# Patient Record
Sex: Female | Born: 1935 | Race: White | Hispanic: No | Smoking: Never smoker
Health system: Southern US, Community
[De-identification: ages and names within clinical notes are randomized; demographics above are authoritative.]

## PROBLEM LIST (undated history)

## (undated) DIAGNOSIS — I639 Cerebral infarction, unspecified: Secondary | ICD-10-CM

## (undated) DIAGNOSIS — I10 Essential (primary) hypertension: Secondary | ICD-10-CM

## (undated) DIAGNOSIS — F0390 Unspecified dementia without behavioral disturbance: Secondary | ICD-10-CM

## (undated) DIAGNOSIS — F329 Major depressive disorder, single episode, unspecified: Secondary | ICD-10-CM

## (undated) DIAGNOSIS — F32A Depression, unspecified: Secondary | ICD-10-CM

## (undated) DIAGNOSIS — M1990 Unspecified osteoarthritis, unspecified site: Secondary | ICD-10-CM

## (undated) DIAGNOSIS — Z86718 Personal history of other venous thrombosis and embolism: Secondary | ICD-10-CM

## (undated) DIAGNOSIS — E079 Disorder of thyroid, unspecified: Secondary | ICD-10-CM

## (undated) DIAGNOSIS — N390 Urinary tract infection, site not specified: Secondary | ICD-10-CM

## (undated) HISTORY — DX: Unspecified osteoarthritis, unspecified site: M19.90

## (undated) HISTORY — DX: Depression, unspecified: F32.A

## (undated) HISTORY — DX: Urinary tract infection, site not specified: N39.0

## (undated) HISTORY — DX: Unspecified dementia, unspecified severity, without behavioral disturbance, psychotic disturbance, mood disturbance, and anxiety: F03.90

## (undated) HISTORY — DX: Cerebral infarction, unspecified: I63.9

## (undated) HISTORY — DX: Major depressive disorder, single episode, unspecified: F32.9

## (undated) HISTORY — DX: Disorder of thyroid, unspecified: E07.9

## (undated) HISTORY — DX: Personal history of other venous thrombosis and embolism: Z86.718

## (undated) HISTORY — DX: Essential (primary) hypertension: I10

---

## 2017-09-03 ENCOUNTER — Ambulatory Visit (INDEPENDENT_AMBULATORY_CARE_PROVIDER_SITE_OTHER): Payer: Medicare Other | Admitting: Family Medicine

## 2017-09-03 ENCOUNTER — Encounter: Payer: Self-pay | Admitting: Family Medicine

## 2017-09-03 VITALS — BP 126/64 | HR 78 | Temp 98.7°F | Ht 65.0 in | Wt 171.0 lb

## 2017-09-03 DIAGNOSIS — R600 Localized edema: Secondary | ICD-10-CM | POA: Diagnosis not present

## 2017-09-03 DIAGNOSIS — Z7689 Persons encountering health services in other specified circumstances: Secondary | ICD-10-CM

## 2017-09-03 DIAGNOSIS — I10 Essential (primary) hypertension: Secondary | ICD-10-CM

## 2017-09-03 DIAGNOSIS — R350 Frequency of micturition: Secondary | ICD-10-CM | POA: Diagnosis not present

## 2017-09-03 DIAGNOSIS — E079 Disorder of thyroid, unspecified: Secondary | ICD-10-CM

## 2017-09-03 LAB — BASIC METABOLIC PANEL
BUN: 23 mg/dL (ref 6–23)
CALCIUM: 9.4 mg/dL (ref 8.4–10.5)
CHLORIDE: 102 meq/L (ref 96–112)
CO2: 28 meq/L (ref 19–32)
CREATININE: 0.98 mg/dL (ref 0.40–1.20)
GFR: 56.68 mL/min — ABNORMAL LOW (ref >60.00)
Glucose, Bld: 95 mg/dL (ref 70–99)
Potassium: 4.1 mEq/L (ref 3.5–5.1)
Sodium: 142 mEq/L (ref 135–145)

## 2017-09-03 LAB — CBC
HEMATOCRIT: 38.3 % (ref 36.0–46.0)
Hemoglobin: 12.8 g/dL (ref 12.0–15.0)
MCHC: 33.4 g/dL (ref 30.0–36.0)
MCV: 96.1 fl (ref 78.0–100.0)
Platelets: 244 10*3/uL (ref 150.0–400.0)
RBC: 3.99 Mil/uL (ref 3.87–5.11)
RDW: 13.4 % (ref 11.5–15.5)
WBC: 9.9 10*3/uL (ref 4.0–10.5)

## 2017-09-03 LAB — POC URINALSYSI DIPSTICK (AUTOMATED)
Bilirubin, UA: NEGATIVE
GLUCOSE UA: NEGATIVE
KETONES UA: NEGATIVE
Nitrite, UA: NEGATIVE
RBC UA: NEGATIVE
SPEC GRAV UA: 1.02 (ref 1.010–1.025)
Urobilinogen, UA: 1 E.U./dL
pH, UA: 6 (ref 5.0–8.0)

## 2017-09-03 LAB — TSH: TSH: 3.28 u[IU]/mL (ref 0.35–4.50)

## 2017-09-03 LAB — BRAIN NATRIURETIC PEPTIDE: PRO B NATRI PEPTIDE: 112 pg/mL — AB (ref 0.0–100.0)

## 2017-09-03 MED ORDER — FUROSEMIDE 20 MG PO TABS: 20.0000 mg | ORAL_TABLET | Freq: Every day | ORAL | 0 refills | Status: DC

## 2017-09-03 NOTE — Patient Instructions (Signed)

## 2017-09-03 NOTE — Progress Notes (Signed)
Patient presents to clinic today to establish care.  Patient is accompanied by her niece and nephew.  SUBJECTIVE: PMH:Pt is an 84 yo female with pmh sig for HTN, dementia, thyroid disorder, CVA, arthritis, depression, h/o blood clots.  Pt was previously seen in [place].  She was recently hospitalized there for depression and UTI.   Pt has been relocated to the area.  She will be living at [facility].  LE edema: -chronic issues -pt does not like wearing TED hose -pt elevates her feet when resting and does not use much salt. -no h/o CHF  HTN: -taking norvasc 10 mg, lopressor 25 mg BID  Depression: -currenlty taking Prozac 20 mg daily -also has ativan 0.5 mg q 8hrs. -pt does not have a psychiatrist in the area  Dementia: -family does not want pt to know this.  Thyroid d/o: -pt does not recall details. -taking levothyroxine 50 mcg daily  Past Medical History:  Diagnosis Date  . Arthritis   . Dementia   . Depression   . H/O blood clots   . Hypertension   . Stroke (HCC)   . Thyroid disease   . Urinary tract infection      Current Outpatient Medications on File Prior to Visit  Medication Sig Dispense Refill  . amLODipine (NORVASC) 10 MG tablet Take 10 mg by mouth daily.    Marland Kitchen FLUoxetine (PROZAC) 20 MG tablet Take 20 mg by mouth daily.    Marland Kitchen levothyroxine (SYNTHROID, LEVOTHROID) 50 MCG tablet Take 50 mcg by mouth daily before breakfast.    . LORazepam (ATIVAN) 0.5 MG tablet Take 0.5 mg by mouth every 8 (eight) hours.    . Melatonin 10 MG TABS Take 10 mg by mouth at bedtime.    . metoprolol tartrate (LOPRESSOR) 25 MG tablet Take 25 mg by mouth 2 (two) times daily.    . Multiple Vitamins-Minerals (EYE VITAMINS PO) Take by mouth.     No current facility-administered medications on file prior to visit.     Allergies  Allergen Reactions  . Iron   . Plaquenil [Hydroxychloroquine Sulfate] Other (See Comments)    Bleached skin    History reviewed. No pertinent family  history.  Social History   Socioeconomic History  . Marital status: Widowed    Spouse name: Not on file  . Number of children: Not on file  . Years of education: Not on file  . Highest education level: Not on file  Occupational History  . Not on file  Social Needs  . Financial resource strain: Not on file  . Food insecurity:    Worry: Not on file    Inability: Not on file  . Transportation needs:    Medical: Not on file    Non-medical: Not on file  Tobacco Use  . Smoking status: Never Smoker  . Smokeless tobacco: Never Used  Substance and Sexual Activity  . Alcohol use: Never    Frequency: Never  . Drug use: Never  . Sexual activity: Not on file  Lifestyle  . Physical activity:    Days per week: Not on file    Minutes per session: Not on file  . Stress: Not on file  Relationships  . Social connections:    Talks on phone: Not on file    Gets together: Not on file    Attends religious service: Not on file    Active member of club or organization: Not on file    Attends meetings of  clubs or organizations: Not on file    Relationship status: Not on file  . Intimate partner violence:    Fear of current or ex partner: Not on file    Emotionally abused: Not on file    Physically abused: Not on file    Forced sexual activity: Not on file  Other Topics Concern  . Not on file  Social History Narrative  . Not on file    ROS General: Denies fever, chills, night sweats, changes in weight, changes in appetite HEENT: Denies headaches, ear pain, changes in vision, rhinorrhea, sore throat CV: Denies CP, palpitations, SOB, orthopnea Pulm: Denies SOB, cough, wheezing GI: Denies abdominal pain, nausea, vomiting, diarrhea, constipation GU: Denies hematuria, vaginal discharge  +dysuria, frequency Msk: Denies muscle cramps, joint pains   +b/l LE edema Neuro: Denies weakness, numbness, tingling Skin: Denies rashes, bruising Psych: Denies depression, anxiety, hallucinations  BP  126/64 (BP Location: Left Arm, Patient Position: Sitting, Cuff Size: Normal)   Pulse 78   Temp 98.7 F (37.1 C) (Oral)   Ht  (1.651 m)   Wt 171 lb (77.6 kg)   SpO2 96%   BMI 28.46 kg/m   Physical Exam Gen. Pleasant, well developed, well-nourished, in NAD HEENT - [place]/AT, PERRL, no scleral icterus, no nasal drainage, pharynx without erythema or exudate. Lungs: no use of accessory muscles, no dullness to percussion, CTAB, no wheezes, rales or rhonchi Cardiovascular: RRR, No r/g/m, 1+ pitting edema in b/l ankles Abdomen: BS present, soft, nontender, nondistended Musculoskeletal: No deformities, moves all four extremities, no cyanosis or clubbing, normal tone Neuro:  A&Ox3, CN II-XII intact, gait not assessed. Sitting in wheelchair. Skin:  Warm, dry, intact, no lesions   Recent Results (from the past 2160 hour(s))  Basic metabolic panel     Status: Abnormal   Collection Time: 09/03/17 12:25 PM  Result Value Ref Range   Sodium 142 135 - 145 mEq/L   Potassium 4.1 3.5 - 5.1 mEq/L   Chloride 102 96 - 112 mEq/L   CO2 28 19 - 32 mEq/L   Glucose, Bld 95 70 - 99 mg/dL   BUN 23 6 - 23 mg/dL   Creatinine, Ser 0.98 0.40 - 1.20 mg/dL   Calcium 9.4 8.4 - 11.9 mg/dL   GFR 14.78 (L) >29.56 mL/min  CBC (no diff)     Status: None   Collection Time: 09/03/17 12:25 PM  Result Value Ref Range   WBC 9.9 4.0 - 10.5 K/uL   RBC 3.99 3.87 - 5.11 Mil/uL   Platelets 244.0 150.0 - 400.0 K/uL   Hemoglobin 12.8 12.0 - 15.0 g/dL   HCT 21.3 08.6 - 57.8 %   MCV 96.1 78.0 - 100.0 fl   MCHC 33.4 30.0 - 36.0 g/dL   RDW 46.9 62.9 - 52.8 %  Brain Natriuretic Peptide     Status: Abnormal   Collection Time: 09/03/17 12:25 PM  Result Value Ref Range   Pro B Natriuretic peptide (BNP) 112.0 (H) 0.0 - 100.0 pg/mL  TSH     Status: None   Collection Time: 09/03/17 12:25 PM  Result Value Ref Range   TSH 3.28 0.35 - 4.50 uIU/mL  POCT Urinalysis Dipstick (Automated)     Status: Abnormal   Collection Time:  09/03/17  1:37 PM  Result Value Ref Range   Color, UA yellow    Clarity, UA clear    Glucose, UA n    Bilirubin, UA n    Ketones, UA n  Spec Grav, UA 1.020 1.010 - 1.025   Blood, UA n    pH, UA 6.0 5.0 - 8.0   Protein, UA 1+    Urobilinogen, UA 1.0 0.2 or 1.0 E.U./dL   Nitrite, UA n    Leukocytes, UA Small (1+) (A) Negative    Assessment/Plan: Urinary frequency  - Plan: POCT Urinalysis Dipstick (Automated)   Lower extremity edema  -Patient taking Norvasc 10 mg daily which may be contributing to lower extremity edema -We will obtain labs -Given handout -Encouraged to continue elevating her extremities when resting and limit sodium intake. - Plan: Basic metabolic panel, CBC (no diff), Brain Natriuretic Peptide ---Of note BMP at 112.  Will send in rx for lasix 20 mg daily x 3 days.  Encounter to establish care --We reviewed the PMH, PSH, FH, SH, Meds and Allergies. -We provided refills for any medications we will prescribe as needed. -We addressed current concerns per orders and patient instructions. -We have asked for records for pertinent exams, studies, vaccines and notes from previous providers. -We have advised patient to follow up per instructions below.  Thyroid disorder  -Continue levothyroxine 50 mcg daily - Plan: TSH  Essential hypertension -controlled -continue Lopressor 25 mg twice daily and Norvasc 10 mg daily   F/u in the next month, sooner if needed.  Unable to address all of family's concerns as they arrived at 11:09 am for the 11 am new pt appt, and were not ready for the provider until 11:30 am.  Abbe Amsterdam, MD

## 2017-09-06 ENCOUNTER — Telehealth: Payer: Self-pay | Admitting: Family Medicine

## 2017-09-06 ENCOUNTER — Other Ambulatory Visit: Payer: Self-pay | Admitting: Family Medicine

## 2017-09-06 DIAGNOSIS — N3000 Acute cystitis without hematuria: Secondary | ICD-10-CM

## 2017-09-06 MED ORDER — NITROFURANTOIN MONOHYD MACRO 100 MG PO CAPS: 100.0000 mg | ORAL_CAPSULE | Freq: Two times a day (BID) | ORAL | 0 refills | Status: AC

## 2017-09-06 NOTE — Telephone Encounter (Signed)
Patient's legal guardian Cassandra Patton called and says she spoke to a nurse on call this weekend about her cough and pink eye. She says an eye drop was prescribed and wanted to make sure there was notation about it in the chart. I advised if the symptoms did not improve, call the office back for an appointment.

## 2017-09-07 NOTE — Telephone Encounter (Signed)
Polytrim eye drops called in by [facility]. Medication added to patient's list.

## 2017-09-10 ENCOUNTER — Other Ambulatory Visit: Payer: Self-pay | Admitting: Family Medicine

## 2017-09-10 ENCOUNTER — Encounter: Payer: Self-pay | Admitting: Adult Health

## 2017-09-10 ENCOUNTER — Ambulatory Visit (INDEPENDENT_AMBULATORY_CARE_PROVIDER_SITE_OTHER): Payer: Medicare Other | Admitting: Adult Health

## 2017-09-10 ENCOUNTER — Ambulatory Visit: Payer: Self-pay

## 2017-09-10 VITALS — BP 130/64 | Temp 98.3°F | Wt 172.0 lb

## 2017-09-10 DIAGNOSIS — R600 Localized edema: Secondary | ICD-10-CM

## 2017-09-10 MED ORDER — FUROSEMIDE 20 MG PO TABS: 20.0000 mg | ORAL_TABLET | Freq: Every day | ORAL | 0 refills | Status: DC

## 2017-09-10 NOTE — Progress Notes (Signed)
Subjective:    Patient ID: Cassandra Patton, female    DOB: 1926-04-15, 84 y.o.   MRN: [id number]  HPI  84 year old female who  has a past medical history of Arthritis, Dementia, Depression, H/O blood clots, Hypertension, Stroke (HCC), Thyroid disease, and Urinary tract infection. She is a patient of Dr. Salomon Fick who I am seeing today for the complaint of bilateral lower extremity swelling. She was recently prescribed a 3 day of Lasix 20 mg. Family reports no change in the amount of edema. She denies any CP or SOB  Wt Readings from Last 3 Encounters:  09/10/17 172 lb (78 kg)  09/03/17 171 lb (77.6 kg)    Review of Systems See HPI   Past Medical History:  Diagnosis Date  . Arthritis   . Dementia   . Depression   . H/O blood clots   . Hypertension   . Stroke (HCC)   . Thyroid disease   . Urinary tract infection     Social History   Socioeconomic History  . Marital status: Widowed    Spouse name: Not on file  . Number of children: Not on file  . Years of education: Not on file  . Highest education level: Not on file  Occupational History  . Not on file  Social Needs  . Financial resource strain: Not on file  . Food insecurity:    Worry: Not on file    Inability: Not on file  . Transportation needs:    Medical: Not on file    Non-medical: Not on file  Tobacco Use  . Smoking status: Never Smoker  . Smokeless tobacco: Never Used  Substance and Sexual Activity  . Alcohol use: Never    Frequency: Never  . Drug use: Never  . Sexual activity: Not on file  Lifestyle  . Physical activity:    Days per week: Not on file    Minutes per session: Not on file  . Stress: Not on file  Relationships  . Social connections:    Talks on phone: Not on file    Gets together: Not on file    Attends religious service: Not on file    Active member of club or organization: Not on file    Attends meetings of clubs or organizations: Not on file    Relationship status: Not on file  .  Intimate partner violence:    Fear of current or ex partner: Not on file    Emotionally abused: Not on file    Physically abused: Not on file    Forced sexual activity: Not on file  Other Topics Concern  . Not on file  Social History Narrative  . Not on file    History reviewed. No pertinent surgical history.  History reviewed. No pertinent family history.  Allergies  Allergen Reactions  . Iron   . Plaquenil [Hydroxychloroquine Sulfate] Other (See Comments)    Bleached skin    Current Outpatient Medications on File Prior to Visit  Medication Sig Dispense Refill  . amLODipine (NORVASC) 10 MG tablet Take 10 mg by mouth daily.    Marland Kitchen FLUoxetine (PROZAC) 20 MG tablet Take 20 mg by mouth daily.    Marland Kitchen levothyroxine (SYNTHROID, LEVOTHROID) 50 MCG tablet Take 50 mcg by mouth daily before breakfast.    . LORazepam (ATIVAN) 0.5 MG tablet Take 0.5 mg by mouth every 8 (eight) hours.    . Melatonin 10 MG TABS Take 10 mg by mouth  at bedtime.    . metoprolol tartrate (LOPRESSOR) 25 MG tablet Take 25 mg by mouth 2 (two) times daily.    . Multiple Vitamins-Minerals (EYE VITAMINS PO) Take by mouth.    . nitrofurantoin, macrocrystal-monohydrate, (MACROBID) 100 MG capsule Take 1 capsule (100 mg total) by mouth 2 (two) times daily for 5 days. 10 capsule 0   No current facility-administered medications on file prior to visit.     BP 130/64   Temp 98.3 F (36.8 C) (Oral)   Wt 172 lb (78 kg)   BMI 28.62 kg/m       Objective:   Physical Exam  Constitutional: She is oriented to person, place, and time. She appears well-developed and well-nourished. No distress.  Cardiovascular: Normal rate, regular rhythm, normal heart sounds and intact distal pulses. Exam reveals no gallop and no friction rub.  No murmur heard. Pulmonary/Chest: Effort normal and breath sounds normal. No stridor. No respiratory distress. She has no wheezes. She has no rales.  Musculoskeletal: She exhibits edema (+2 bilateral  pitting edema ).  Neurological: She is alert and oriented to person, place, and time.  Skin: Skin is warm and dry. Capillary refill takes less than 2 seconds. She is not diaphoretic.  Psychiatric: She has a normal mood and affect. Her behavior is normal. Judgment and thought content normal.  Nursing note and vitals reviewed.     Assessment & Plan:  1. Lower extremity edema - Will trial her on a longer course of Lasix. Advised follow up with PCP next week  - furosemide (LASIX) 20 MG tablet; Take 1 tablet (20 mg total) by mouth daily.  Dispense: 10 tablet; Refill: 0 - Consider changing Norvasc or Metoprolol due to potential for lower extremity swelling   Shirline Frees, NP

## 2017-09-10 NOTE — Telephone Encounter (Signed)
    Incoming call from nephew who is on DPI, states that he is not with the pt. But  Wanted to discuss concerns about lower bilateral extremity swelling,   From feet to hips. Pt. Did take the 3 day dose of Lasix. Nephew states no improvement.  Denies SOB nor chest pain. Nephew does state she has "cold symptoms".  Nephew states when pressing on area of swelling "it bounces back".   Due to significant LE swelling, scheduled appointment in PCP office at 2:45pm.  Nephew voiced understanding.   Reason for Disposition . SEVERE leg swelling (e.g., swelling extends above knee, entire leg is swollen, weeping fluid)  Answer Assessment - Initial Assessment Questions 1. ONSET: "When did the swelling start?" (e.g., minutes, hours, days)    5/10  2. LOCATION: "What part of the leg is swollen?"  "Are both legs swollen or just one leg?"      Both legs from hip down , ankles and feet 3. SEVERITY: "How bad is the swelling?" (e.g., localized; mild, moderate, severe)  - Localized - small area of swelling localized to one leg  - MILD pedal edema - swelling limited to foot and ankle, pitting edema < 1/4 inch (6 mm) deep, rest and elevation eliminate most or all swelling  - MODERATE edema - swelling of lower leg to knee, pitting edema > 1/4 inch (6 mm) deep, rest and elevation only partially reduce swelling  - SEVERE edema - swelling extends above knee, facial or hand swelling present      severe 4. REDNESS: "Does the swelling look red or infected?"    Bruises," scaring" 5. PAIN: "Is the swelling painful to touch?" If so, ask: "How painful is it?"   (Scale 1-10; mild, moderate or severe)      Pt. Not avilable 6. FEVER: "Do you have a fever?" If so, ask: "What is it, how was it measured, and when did it start?" no     Nephew  Doesn't know 7. CAUSE: "What do you think is causing the leg swelling?"     ongoing 8. MEDICAL HISTORY: "Do you have a history of heart failure, kidney disease, liver failure, or cancer?" hx  of HTN     9. RECURRENT SYMPTOM: "Have you had leg swelling before?" If so, ask: "When was the last time?" "What happened that time?"      10. OTHER SYMPTOMS: "Do you have any other symptoms?" (e.g., chest pain, difficulty breathing) no    11. PREGNANCY: "Is there any chance you are pregnant?" "When was your last menstrual period?"  Protocols used: LEG SWELLING AND EDEMA-A-AH  Summary: swelling follow up     Pt is in independent living - pt was given fluid pills for lowe leg swelling on 09/03/17. Family is calling to let us know she is still having lower leg swelling and wants to know what to do next about it. He is not with her at the moment but will be at the independent living later this am

## 2017-09-10 NOTE — Telephone Encounter (Signed)
Please advise. You have never filled these meds for patient before.

## 2017-09-13 ENCOUNTER — Other Ambulatory Visit: Payer: Self-pay | Admitting: Family Medicine

## 2017-09-13 DIAGNOSIS — I10 Essential (primary) hypertension: Secondary | ICD-10-CM

## 2017-09-13 MED ORDER — METOPROLOL TARTRATE 25 MG PO TABS: 25.0000 mg | ORAL_TABLET | Freq: Two times a day (BID) | ORAL | 0 refills | Status: AC

## 2017-09-13 MED ORDER — LEVOTHYROXINE SODIUM 50 MCG PO TABS: 50.0000 ug | ORAL_TABLET | Freq: Every day | ORAL | 0 refills | Status: DC

## 2017-09-13 MED ORDER — AMLODIPINE BESYLATE 5 MG PO TABS: 5.0000 mg | ORAL_TABLET | Freq: Every day | ORAL | 3 refills | Status: DC

## 2017-09-13 MED ORDER — FLUOXETINE HCL 20 MG PO TABS: 20.0000 mg | ORAL_TABLET | Freq: Every day | ORAL | 1 refills | Status: AC

## 2017-09-13 NOTE — Telephone Encounter (Signed)
Pt's guardian notified of results/instructions and verbalized understanding. She is working on finding a Therapist, sports and will contact the office if she is not able to get patient scheduled before her ativan runs out at the beginning of June.

## 2017-09-13 NOTE — Telephone Encounter (Signed)
Pt is to be finding a Psychiatrist.  No referral is needed for this.  Given pt's continued LE edema I will reduce the amount of norvasc she is on from 10 mg to 5 mg as it at times can cause LE edema.

## 2017-09-14 ENCOUNTER — Telehealth: Payer: Self-pay | Admitting: Family Medicine

## 2017-09-14 ENCOUNTER — Telehealth: Payer: Self-pay | Admitting: *Deleted

## 2017-09-14 NOTE — Telephone Encounter (Signed)
Called patient and left message to return call.  I spoke w/ Cassandra Patton yesterday and notified her that Dr. Salomon Fick denied the lorazepam refill and instructed her to work on establishing patient w/ a psychiatrist, as this is who should take over her lorazepam rx per Dr. Salomon Fick. Cassandra Patton was going to try to get patient established prior to her next refill.

## 2017-09-14 NOTE — Telephone Encounter (Signed)
Robin returned call. She is concerned that patient will not be able to get in with a local psychiatrist before she runs out of lorazepam at the beginning of June. The patient was recently hospitalized in [place] with anxiety, depression, and dementia and Zella Ball states it took quite a while before she found a medication regimen that stabilized her symptoms, so they do not want her to run out of medication for fear she may become combative and agitated.   Zella Ball would like to know if Dr. Salomon Fick will prescribe 1 month of lorazepam to allow them more time to find a psychiatrist. She also asked why this medication needs to be prescribed by psychiatry instead of Dr. Salomon Fick and states that in [place], pt's PCP prescribed the medication. I explained that lorazepam is a controlled substance and Dr. Salomon Fick does not prescribe this class of medications long term.   They have reached out to [facility], but were told patient will need a 3-4 hour appointment and Zella Ball is not sure that the patient is "up for that kind of ordeal" given her current health status. I offered to provide them a list of local psychiatrist's that may be able to see patient in a shorter appt time and will give this to patient at next appointment on Thursday.  Zella Ball also states that patient is taking seroquel and will need a refill on this medication. However, our current medication list does not reflect seroquel. Zella Ball will verify if patient is still taking this and dose if so and will bring the information to patient's appointment on Thursday.   Please advise regarding lorazepam refill.

## 2017-09-14 NOTE — Telephone Encounter (Signed)
[facility] takes walk ins.

## 2017-09-14 NOTE — Telephone Encounter (Signed)
Copied from CRM [id number]. Topic: Inquiry >> Sep 13, 2017 12:01 PM Yvonna Alanis wrote: Reason for CRM: Patient's niece called requested a refill of LORazepam (ATIVAN) 0.5 MG tablet. Patient's niece states that the patient needs this medication for June. Patient's preferred pharmacy is [facility] - [place], [place] - [address] [phone] (Phone)  [phone] (Fax)                  Thank You!!!

## 2017-09-14 NOTE — Telephone Encounter (Signed)
Copied from CRM #[id number]. >> Sep 14, 2017  3:51 PM Percival Spanish wrote:  Su Hoff with [facility] call to ask for verbal orders for PT and OT    [phone]

## 2017-09-15 NOTE — Telephone Encounter (Signed)
Please advise 

## 2017-09-15 NOTE — Telephone Encounter (Signed)
ok 

## 2017-09-16 ENCOUNTER — Encounter: Payer: Self-pay | Admitting: Family Medicine

## 2017-09-16 ENCOUNTER — Other Ambulatory Visit: Payer: Self-pay

## 2017-09-16 ENCOUNTER — Emergency Department (HOSPITAL_COMMUNITY): Payer: Medicare Other

## 2017-09-16 ENCOUNTER — Inpatient Hospital Stay (HOSPITAL_COMMUNITY)
Admission: EM | Admit: 2017-09-16 | Discharge: 2017-09-22 | DRG: 292 | Disposition: A | Discharge status: Skilled Nursing Facility | Payer: Medicare Other | Attending: Internal Medicine | Admitting: Internal Medicine

## 2017-09-16 ENCOUNTER — Encounter (HOSPITAL_COMMUNITY): Payer: Self-pay | Admitting: Emergency Medicine

## 2017-09-16 ENCOUNTER — Ambulatory Visit: Payer: Medicare Other | Admitting: Family Medicine

## 2017-09-16 ENCOUNTER — Ambulatory Visit (INDEPENDENT_AMBULATORY_CARE_PROVIDER_SITE_OTHER): Payer: Medicare Other | Admitting: Family Medicine

## 2017-09-16 ENCOUNTER — Emergency Department (HOSPITAL_BASED_OUTPATIENT_CLINIC_OR_DEPARTMENT_OTHER)
Admit: 2017-09-16 | Discharge: 2017-09-16 | Disposition: A | Discharge status: Home / Self Care | Payer: Medicare Other | Attending: Emergency Medicine | Admitting: Emergency Medicine

## 2017-09-16 ENCOUNTER — Encounter (HOSPITAL_COMMUNITY): Payer: Self-pay | Admitting: Student

## 2017-09-16 VITALS — BP 138/58 | HR 74 | Temp 98.4°F | Wt 176.0 lb

## 2017-09-16 DIAGNOSIS — Z8673 Personal history of transient ischemic attack (TIA), and cerebral infarction without residual deficits: Secondary | ICD-10-CM | POA: Diagnosis not present

## 2017-09-16 DIAGNOSIS — Y92230 Patient room in hospital as the place of occurrence of the external cause: Secondary | ICD-10-CM | POA: Diagnosis not present

## 2017-09-16 DIAGNOSIS — Z888 Allergy status to other drugs, medicaments and biological substances status: Secondary | ICD-10-CM | POA: Diagnosis not present

## 2017-09-16 DIAGNOSIS — F329 Major depressive disorder, single episode, unspecified: Secondary | ICD-10-CM | POA: Insufficient documentation

## 2017-09-16 DIAGNOSIS — R609 Edema, unspecified: Secondary | ICD-10-CM

## 2017-09-16 DIAGNOSIS — L97929 Non-pressure chronic ulcer of unspecified part of left lower leg with unspecified severity: Secondary | ICD-10-CM | POA: Diagnosis present

## 2017-09-16 DIAGNOSIS — N179 Acute kidney failure, unspecified: Secondary | ICD-10-CM | POA: Diagnosis not present

## 2017-09-16 DIAGNOSIS — I5030 Unspecified diastolic (congestive) heart failure: Secondary | ICD-10-CM | POA: Diagnosis present

## 2017-09-16 DIAGNOSIS — F419 Anxiety disorder, unspecified: Secondary | ICD-10-CM | POA: Diagnosis not present

## 2017-09-16 DIAGNOSIS — Z79890 Hormone replacement therapy (postmenopausal): Secondary | ICD-10-CM | POA: Diagnosis not present

## 2017-09-16 DIAGNOSIS — R600 Localized edema: Secondary | ICD-10-CM

## 2017-09-16 DIAGNOSIS — F0390 Unspecified dementia without behavioral disturbance: Secondary | ICD-10-CM | POA: Diagnosis present

## 2017-09-16 DIAGNOSIS — E876 Hypokalemia: Secondary | ICD-10-CM | POA: Diagnosis present

## 2017-09-16 DIAGNOSIS — E039 Hypothyroidism, unspecified: Secondary | ICD-10-CM | POA: Diagnosis present

## 2017-09-16 DIAGNOSIS — M1990 Unspecified osteoarthritis, unspecified site: Secondary | ICD-10-CM | POA: Diagnosis present

## 2017-09-16 DIAGNOSIS — Z79899 Other long term (current) drug therapy: Secondary | ICD-10-CM

## 2017-09-16 DIAGNOSIS — I110 Hypertensive heart disease with heart failure: Principal | ICD-10-CM | POA: Diagnosis present

## 2017-09-16 DIAGNOSIS — Z86718 Personal history of other venous thrombosis and embolism: Secondary | ICD-10-CM

## 2017-09-16 DIAGNOSIS — I5033 Acute on chronic diastolic (congestive) heart failure: Secondary | ICD-10-CM | POA: Diagnosis present

## 2017-09-16 DIAGNOSIS — F0391 Unspecified dementia with behavioral disturbance: Secondary | ICD-10-CM

## 2017-09-16 DIAGNOSIS — H9190 Unspecified hearing loss, unspecified ear: Secondary | ICD-10-CM | POA: Diagnosis present

## 2017-09-16 DIAGNOSIS — I5023 Acute on chronic systolic (congestive) heart failure: Secondary | ICD-10-CM

## 2017-09-16 DIAGNOSIS — I4581 Long QT syndrome: Secondary | ICD-10-CM | POA: Diagnosis present

## 2017-09-16 DIAGNOSIS — E869 Volume depletion, unspecified: Secondary | ICD-10-CM | POA: Diagnosis present

## 2017-09-16 DIAGNOSIS — I509 Heart failure, unspecified: Secondary | ICD-10-CM

## 2017-09-16 DIAGNOSIS — T502X5A Adverse effect of carbonic-anhydrase inhibitors, benzothiadiazides and other diuretics, initial encounter: Secondary | ICD-10-CM | POA: Diagnosis not present

## 2017-09-16 DIAGNOSIS — F03918 Unspecified dementia, unspecified severity, with other behavioral disturbance: Secondary | ICD-10-CM | POA: Insufficient documentation

## 2017-09-16 DIAGNOSIS — R946 Abnormal results of thyroid function studies: Secondary | ICD-10-CM | POA: Diagnosis present

## 2017-09-16 DIAGNOSIS — M7989 Other specified soft tissue disorders: Secondary | ICD-10-CM | POA: Diagnosis not present

## 2017-09-16 DIAGNOSIS — R5381 Other malaise: Secondary | ICD-10-CM | POA: Diagnosis present

## 2017-09-16 DIAGNOSIS — I361 Nonrheumatic tricuspid (valve) insufficiency: Secondary | ICD-10-CM | POA: Diagnosis not present

## 2017-09-16 DIAGNOSIS — F32A Depression, unspecified: Secondary | ICD-10-CM | POA: Insufficient documentation

## 2017-09-16 LAB — BASIC METABOLIC PANEL
Anion gap: 11 (ref 5–15)
BUN: 15 mg/dL (ref 6–20)
CALCIUM: 8.7 mg/dL — AB (ref 8.9–10.3)
CO2: 27 mmol/L (ref 22–32)
Chloride: 99 mmol/L — ABNORMAL LOW (ref 101–111)
Creatinine, Ser: 0.89 mg/dL (ref 0.44–1.00)
GFR, EST NON AFRICAN AMERICAN: 56 mL/min — AB (ref >60)
GLUCOSE: 131 mg/dL — AB (ref 65–99)
POTASSIUM: 2.9 mmol/L — AB (ref 3.5–5.1)
Sodium: 137 mmol/L (ref 135–145)

## 2017-09-16 LAB — URINALYSIS, ROUTINE W REFLEX MICROSCOPIC
Bilirubin Urine: NEGATIVE
Glucose, UA: NEGATIVE mg/dL
Hgb urine dipstick: NEGATIVE
Ketones, ur: NEGATIVE mg/dL
LEUKOCYTES UA: NEGATIVE
NITRITE: NEGATIVE
PROTEIN: NEGATIVE mg/dL
SPECIFIC GRAVITY, URINE: 1.012 (ref 1.005–1.030)
pH: 6 (ref 5.0–8.0)

## 2017-09-16 LAB — CBC WITH DIFFERENTIAL/PLATELET
BASOS ABS: 0 10*3/uL (ref 0.0–0.1)
BASOS PCT: 0 %
Eosinophils Absolute: 0.1 10*3/uL (ref 0.0–0.7)
Eosinophils Relative: 1 %
HEMATOCRIT: 35.2 % — AB (ref 36.0–46.0)
HEMOGLOBIN: 11.7 g/dL — AB (ref 12.0–15.0)
LYMPHS PCT: 21 %
Lymphs Abs: 1.4 10*3/uL (ref 0.7–4.0)
MCH: 30.8 pg (ref 26.0–34.0)
MCHC: 33.2 g/dL (ref 30.0–36.0)
MCV: 92.6 fL (ref 78.0–100.0)
Monocytes Absolute: 0.7 10*3/uL (ref 0.1–1.0)
Monocytes Relative: 10 %
NEUTROS PCT: 68 %
Neutro Abs: 4.7 10*3/uL (ref 1.7–7.7)
Platelets: 372 10*3/uL (ref 150–400)
RBC: 3.8 MIL/uL — ABNORMAL LOW (ref 3.87–5.11)
RDW: 12.9 % (ref 11.5–15.5)
WBC: 7 10*3/uL (ref 4.0–10.5)

## 2017-09-16 LAB — CREATININE, SERUM
Creatinine, Ser: 0.88 mg/dL (ref 0.44–1.00)
GFR calc Af Amer: >60 mL/min (ref >60)
GFR calc non Af Amer: 57 mL/min — ABNORMAL LOW (ref >60)

## 2017-09-16 LAB — CBC
HCT: 37.2 % (ref 36.0–46.0)
Hemoglobin: 12.7 g/dL (ref 12.0–15.0)
MCH: 31.9 pg (ref 26.0–34.0)
MCHC: 34.1 g/dL (ref 30.0–36.0)
MCV: 93.5 fL (ref 78.0–100.0)
PLATELETS: 383 10*3/uL (ref 150–400)
RBC: 3.98 MIL/uL (ref 3.87–5.11)
RDW: 12.9 % (ref 11.5–15.5)
WBC: 7.9 10*3/uL (ref 4.0–10.5)

## 2017-09-16 LAB — TSH: TSH: 6.803 u[IU]/mL — ABNORMAL HIGH (ref 0.350–4.500)

## 2017-09-16 LAB — TROPONIN I

## 2017-09-16 LAB — MRSA PCR SCREENING: MRSA by PCR: NEGATIVE

## 2017-09-16 LAB — BRAIN NATRIURETIC PEPTIDE
B NATRIURETIC PEPTIDE 5: 226.7 pg/mL — AB (ref 0.0–100.0)
B Natriuretic Peptide: 179.4 pg/mL — ABNORMAL HIGH (ref 0.0–100.0)

## 2017-09-16 LAB — MAGNESIUM: MAGNESIUM: 1.8 mg/dL (ref 1.7–2.4)

## 2017-09-16 MED ORDER — FUROSEMIDE 10 MG/ML IJ SOLN
60.0000 mg | Freq: Two times a day (BID) | INTRAMUSCULAR | Status: DC
  Administered 2017-09-16 – 2017-09-18 (×4): 60 mg via INTRAVENOUS
  Filled 2017-09-16 (×4): qty 6

## 2017-09-16 MED ORDER — LEVOTHYROXINE SODIUM 50 MCG PO TABS
50.0000 ug | ORAL_TABLET | Freq: Every day | ORAL | Status: DC
  Filled 2017-09-16: qty 1

## 2017-09-16 MED ORDER — ONDANSETRON HCL 4 MG/2ML IJ SOLN: 4.0000 mg | Freq: Four times a day (QID) | INTRAMUSCULAR | Status: DC | PRN

## 2017-09-16 MED ORDER — ACETAMINOPHEN 325 MG PO TABS: 650.0000 mg | ORAL_TABLET | ORAL | Status: DC | PRN

## 2017-09-16 MED ORDER — FLUOXETINE HCL 20 MG PO CAPS
20.0000 mg | ORAL_CAPSULE | Freq: Every day | ORAL | Status: DC
  Administered 2017-09-17 – 2017-09-22 (×6): 20 mg via ORAL
  Filled 2017-09-16 (×6): qty 1

## 2017-09-16 MED ORDER — LORAZEPAM 0.5 MG PO TABS
0.5000 mg | ORAL_TABLET | Freq: Three times a day (TID) | ORAL | Status: DC
  Administered 2017-09-16 – 2017-09-21 (×15): 0.5 mg via ORAL
  Filled 2017-09-16 (×15): qty 1

## 2017-09-16 MED ORDER — SODIUM CHLORIDE 0.9% FLUSH: 3.0000 mL | INTRAVENOUS | Status: DC | PRN

## 2017-09-16 MED ORDER — POTASSIUM CHLORIDE CRYS ER 20 MEQ PO TBCR
40.0000 meq | EXTENDED_RELEASE_TABLET | Freq: Once | ORAL | Status: AC
  Administered 2017-09-16: 40 meq via ORAL
  Filled 2017-09-16: qty 2

## 2017-09-16 MED ORDER — QUETIAPINE FUMARATE 25 MG PO TABS
25.0000 mg | ORAL_TABLET | Freq: Every day | ORAL | Status: DC
  Administered 2017-09-16 – 2017-09-20 (×5): 25 mg via ORAL
  Filled 2017-09-16 (×5): qty 1

## 2017-09-16 MED ORDER — SODIUM CHLORIDE 0.9% FLUSH
3.0000 mL | Freq: Two times a day (BID) | INTRAVENOUS | Status: DC
  Administered 2017-09-16 – 2017-09-21 (×10): 3 mL via INTRAVENOUS

## 2017-09-16 MED ORDER — SODIUM CHLORIDE 0.9 % IV SOLN: 250.0000 mL | INTRAVENOUS | Status: DC | PRN

## 2017-09-16 MED ORDER — ENOXAPARIN SODIUM 40 MG/0.4ML ~~LOC~~ SOLN
40.0000 mg | SUBCUTANEOUS | Status: DC
  Administered 2017-09-16 – 2017-09-21 (×6): 40 mg via SUBCUTANEOUS
  Filled 2017-09-16 (×6): qty 0.4

## 2017-09-16 MED ORDER — FUROSEMIDE 10 MG/ML IJ SOLN
40.0000 mg | Freq: Once | INTRAMUSCULAR | Status: AC
  Administered 2017-09-16: 40 mg via INTRAVENOUS
  Filled 2017-09-16: qty 4

## 2017-09-16 MED ORDER — POTASSIUM CHLORIDE CRYS ER 20 MEQ PO TBCR
30.0000 meq | EXTENDED_RELEASE_TABLET | Freq: Three times a day (TID) | ORAL | Status: DC
  Administered 2017-09-16 – 2017-09-17 (×4): 30 meq via ORAL
  Filled 2017-09-16 (×4): qty 1

## 2017-09-16 MED ORDER — METOPROLOL TARTRATE 25 MG PO TABS
25.0000 mg | ORAL_TABLET | Freq: Two times a day (BID) | ORAL | Status: DC
  Administered 2017-09-16 – 2017-09-22 (×12): 25 mg via ORAL
  Filled 2017-09-16 (×12): qty 1

## 2017-09-16 NOTE — Telephone Encounter (Signed)
Noted. Family is aware of this, but was told their intake process takes 3-4 hours and they are not sure if patient is "up for that."

## 2017-09-16 NOTE — Telephone Encounter (Signed)
Verbal orders called as directed.  

## 2017-09-16 NOTE — Progress Notes (Signed)
Preliminary notes--Left lower extremity venous duplex exam completed. Negative DVT, study limited due to patient severe pitting edema.  Result notified RN.  Hongying Richardson Dopp (RDMS RVT) 09/16/17 4:09 PM

## 2017-09-16 NOTE — H&P (Signed)
[facility] History and Physical  Cassandra Patton:[id number] DOB: 1925/08/11 DOA: 09/16/2017  Referring physician:   PCP: Deeann Saint, MD   Chief Complaint:  Shortness of breath  HPI:   84 year old female with a history of depression, dementia, hypertension, hypothyroidism, presents to the ED today with worsening lower extremity edema, gradually worsening with weeping, inability to walk, abdominal distention, significant orthopnea, all worsening over the course of the last 3 months and particularly worse over the last 3 weeks Patient was seen by PCP on 09/03/17, was found to have a BNP of 112. She was given a three-day course of Lasix. She returned to PCP on 5/17 with no significant improvement. Her amlodipine was decreased to 5 mg because of concerns about possibly contributing to her dependent edema. Patient continued to get worse again weight and therefore she was referred to the ED for further evaluation. ED course BP (!) 147/87 (BP Location: Left Arm)   Pulse 74   Temp 98.2 F (36.8 C) (Oral)   Resp 16   Ht  (1.626 m)   Wt 79.8 kg (176 lb)   SpO2 100%   BMI 30.21 kg/m  Patient had a venous Doppler of the lower extremity that was negative for DVT Potassium 2.9, BNP 179. Creatinine 0.89 hemoglobin 11.7, white count 7.0, troponin negative chest x-ray without any active disease     Review of Systems: negative for the following  Complete review of systems was done as documented in history of present illness   Past Medical History:  Diagnosis Date  . Arthritis   . Dementia   . Depression   . H/O blood clots   . Hypertension   . Stroke (HCC)   . Thyroid disease   . Urinary tract infection      History reviewed. No pertinent surgical history.    Social History:  reports that she has never smoked. She has never used smokeless tobacco. She reports that she does not drink alcohol or use drugs.    Allergies  Allergen Reactions  . Iron   . Plaquenil  [Hydroxychloroquine Sulfate] Other (See Comments)    Bleached skin        FAMILY HISTORY  When questioned  Directly-patient reports  No family history of HTN, CVA ,DIABETES, TB, Cancer CAD, Bleeding Disorders, Sickle Cell, diabetes, anemia, asthma,   Prior to Admission medications   Medication Sig Start Date End Date Taking? Authorizing Provider  amLODipine (NORVASC) 5 MG tablet Take 1 tablet (5 mg total) by mouth daily. 09/13/17  Yes Deeann Saint, MD  FLUoxetine (PROZAC) 20 MG tablet Take 1 tablet (20 mg total) by mouth daily. 09/13/17  Yes Deeann Saint, MD  furosemide (LASIX) 20 MG tablet Take 1 tablet (20 mg total) by mouth daily. 09/10/17  Yes Nafziger, Kandee Keen, NP  levothyroxine (SYNTHROID, LEVOTHROID) 50 MCG tablet Take 1 tablet (50 mcg total) by mouth daily before breakfast. 09/13/17  Yes Deeann Saint, MD  LORazepam (ATIVAN) 0.5 MG tablet Take 0.5 mg by mouth every 8 (eight) hours.   Yes [provider]  Melatonin 10 MG TABS Take 10 mg by mouth at bedtime.   Yes [provider]  metoprolol tartrate (LOPRESSOR) 25 MG tablet Take 1 tablet (25 mg total) by mouth 2 (two) times daily. 09/13/17  Yes Deeann Saint, MD  Multiple Vitamins-Minerals (EYE VITAMINS PO) Take 1 capsule by mouth daily.    Yes [provider]  QUEtiapine (SEROQUEL) 25 MG tablet  Take 25 mg by mouth at bedtime.   Yes [provider]     Physical Exam: Vitals:   09/16/17 1218 09/16/17 1412 09/16/17 1630  BP: (!) 147/87 (!) 141/105 (!) 152/100  Pulse: 74 80 82  Resp: Temp: 98.2 F (36.8 C) 98.1 F (36.7 C)   TempSrc: Oral Oral   SpO2: 100% 97% 96%  Weight: 79.8 kg (176 lb)    Height:  (1.626 m)          Vitals:   09/16/17 1218 09/16/17 1412 09/16/17 1630  BP: (!) 147/87 (!) 141/105 (!) 152/100  Pulse: 74 80 82  Resp: Temp: 98.2 F (36.8 C) 98.1 F (36.7 C)   TempSrc: Oral Oral   SpO2: 100% 97% 96%  Weight: 79.8 kg (176 lb)     Height:  (1.626 m)     Constitutional: NAD, calm, comfortable, hard of hearing Eyes: PERRL, lids and conjunctivae normal ENMT: Mucous membranes are moist. Posterior pharynx clear of any exudate or lesions.Normal dentition.  Neck: normal, supple, no masses, no thyromegaly Respiratory: clear to auscultation bilaterally, no wheezing, no crackles. Normal respiratory effort. No accessory muscle use.  Cardiovascular: Regular rate and rhythm, no murmurs / rubs / gallops. 3+ extremity edema all the way up to the high. 2+ pedal pulses. No carotid bruits.  Abdomen: no tenderness, no masses palpated. No hepatosplenomegaly. Bowel sounds positive.  Musculoskeletal: no clubbing / cyanosis. No joint deformity upper and lower extremities. Good ROM, no contractures. Normal muscle tone.  Skin: no rashes, lesions, ulcers. No induration Neurologic: CN 2-12 grossly intact. Sensation intact, DTR normal. Strength 5/5 in all 4.  Psychiatric: Normal judgment and insight. Alert and oriented x 3. Normal mood.     Labs on Admission: I have personally reviewed following labs and imaging studies  CBC: Recent Labs  Lab 09/16/17 1528  WBC 7.0  NEUTROABS 4.7  HGB 11.7*  HCT 35.2*  MCV 92.6  PLT 372    Basic Metabolic Panel: Recent Labs  Lab 09/16/17 1528  NA 137  K 2.9*  CL 99*  CO2 27  GLUCOSE 131*  BUN 15  CREATININE 0.89  CALCIUM 8.7*    GFR: Estimated Creatinine Clearance: 43.8 mL/min (by C-G formula based on SCr of 0.89 mg/dL).  Liver Function Tests: No results for input(s): AST, ALT, ALKPHOS, BILITOT, PROT, ALBUMIN in the last 168 hours. No results for input(s): LIPASE, AMYLASE in the last 168 hours. No results for input(s): AMMONIA in the last 168 hours.  Coagulation Profile: No results for input(s): INR, PROTIME in the last 168 hours. No results for input(s): DDIMER in the last 72 hours.  Cardiac Enzymes: Recent Labs  Lab 09/16/17 1528  TROPONINI <0.03    BNP (last 3  results) Recent Labs    09/03/17 1225  PROBNP 112.0*    HbA1C: No results for input(s): HGBA1C in the last 72 hours. No results found for: HGBA1C   CBG: No results for input(s): GLUCAP in the last 168 hours.  Lipid Profile: No results for input(s): CHOL, HDL, LDLCALC, TRIG, CHOLHDL, LDLDIRECT in the last 72 hours.  Thyroid Function Tests: No results for input(s): TSH, T4TOTAL, FREET4, T3FREE, THYROIDAB in the last 72 hours.  Anemia Panel: No results for input(s): VITAMINB12, FOLATE, FERRITIN, TIBC, IRON, RETICCTPCT in the last 72 hours.  Urine analysis:    Component Value Date/Time   COLORURINE YELLOW 09/16/2017 1502   APPEARANCEUR CLEAR 09/16/2017 1502  LABSPEC 1.012 09/16/2017 1502   PHURINE 6.0 09/16/2017 1502   GLUCOSEU NEGATIVE 09/16/2017 1502   HGBUR NEGATIVE 09/16/2017 1502   BILIRUBINUR NEGATIVE 09/16/2017 1502   BILIRUBINUR n 09/03/2017 1337   KETONESUR NEGATIVE 09/16/2017 1502   PROTEINUR NEGATIVE 09/16/2017 1502   UROBILINOGEN 1.0 09/03/2017 1337   NITRITE NEGATIVE 09/16/2017 1502   LEUKOCYTESUR NEGATIVE 09/16/2017 1502    Sepsis Labs: (procalcitonin:4,lacticidven:4) )No results found for this or any previous visit (from the past 240 hour(s)).       Radiological Exams on Admission: Dg Chest 2 View  Result Date: 09/16/2017 CLINICAL DATA:  Lower extremity swelling. EXAM: CHEST - 2 VIEW COMPARISON:  None. FINDINGS: The heart is at the upper limits of normal in size. Normal mediastinal contours. Atherosclerotic calcification of the aortic arch. Normal pulmonary vascularity. Elevation of the right hemidiaphragm. No focal consolidation, pleural effusion, or pneumothorax. No acute osseous abnormality. IMPRESSION: 1.  No active cardiopulmonary disease. 2.  Aortic atherosclerosis (ICD10-I70.0). Electronically Signed   By: Obie Dredge M.D.   On: 09/16/2017 15:20   Dg Chest 2 View  Result Date: 09/16/2017 CLINICAL DATA:  Lower extremity  swelling. EXAM: CHEST - 2 VIEW COMPARISON:  None. FINDINGS: The heart is at the upper limits of normal in size. Normal mediastinal contours. Atherosclerotic calcification of the aortic arch. Normal pulmonary vascularity. Elevation of the right hemidiaphragm. No focal consolidation, pleural effusion, or pneumothorax. No acute osseous abnormality. IMPRESSION: 1.  No active cardiopulmonary disease. 2.  Aortic atherosclerosis (ICD10-I70.0). Electronically Signed   By: Obie Dredge M.D.   On: 09/16/2017 15:20      EKG: Independently reviewed. * Sinus rhythm Borderline right axis deviation Borderline low voltage, extremity leads Nonspecific T abnormalities, lateral leads Prolonged QT interval No STEMI    Assessment/Plan Bilateral lower extremity edema, suspected to be secondary to congestive heart failure versus severe pulmonary hypertension and cor pulmonale Venous Doppler negative for DVT on the left Patient has received Lasix 40 mg IV 1 Will continue diuresis with Lasix 40 mg IV twice a day 2-D echo EKG without any acute ischemic changes Chest x-ray without any active disease Patient will be admitted to the inpatient telemetry unit Anticipate that she will need aggressive diuresis for several days Check TSH Wound care consult for weeping lower extremity skin wounds, no evidence of cellulitis  Hypertension Hold Norvasc, continue metoprolol  hypothyroidism-continue Synthroid, check TSH  Dementia/depression Continue Prozac, lorazepam, Seroquel     DVT prophylaxis:       Code Status Orders  (From admission, onward)      consults called: none   Family Communication: Admission, patients condition and plan of care including tests being ordered have been discussed with the patient  who indicates understanding and agree with the plan and Code Status   Admission status:  The appropriate patient status for this patient is INPATIENT. Inpatient status is judged to be reasonable and  necessary in order to provide the required intensity of service to ensure the patient's safety. The patient's presenting symptoms, physical exam findings, and initial radiographic and laboratory data in the context of their chronic comorbidities is felt to place them at high risk for further clinical deterioration. Furthermore, it is not anticipated that the patient will be medically stable for discharge from the hospital within 2 midnights of admission. The following factors support the patient status of inpatient.    "           The patient's presenting symptoms include low back  pain. "           The worrisome physical exam findings include and inability to walk. "           The initial radiographic and laboratory data are worrisome because of sacral fracture on CT. "           The chronic co-morbidities include history of hypertension.     * I certify that at the point of admission it is my clinical judgment that the patient will require inpatient hospital care spanning beyond 2 midnights from the point of admission due to high intensity of service, high risk for further deterioration and high frequency of surveillance required.*    Disposition plan: Further plan will depend as patient's clinical course evolves and further radiologic and laboratory data become available. Likely home when stable      Richarda Overlie MD [facility] Pager [phone]  If 7PM-7AM, please contact night-coverage [email or web address] Password [email or web address]  09/16/2017, 5:00 PM

## 2017-09-16 NOTE — ED Notes (Signed)
ED TO INPATIENT HANDOFF REPORT  Name/Age/Gender Cassandra Patton 84 y.o. female  Code Status    Code Status Orders  (From admission, onward)        Start     Ordered   09/16/17 1659  Full code  Continuous     09/16/17 1700    Code Status History    This patient has a current code status but no historical code status.    Advance Directive Documentation     Most Recent Value  Type of Advance Directive  Healthcare Power of Attorney, Living will  Pre-existing out of facility DNR order (yellow form or pink MOST form)  -  "MOST" Form in Place?  -      Home/SNF/Other Skilled nursing facility  Chief Complaint Lower extremity edma   Level of Care/Admitting Diagnosis ED Disposition    ED Disposition Condition [place]: [place] [[id number]]  Level of Care: Telemetry [5]  Admit to tele based on following criteria: Complex arrhythmia (Bradycardia/Tachycardia)  Diagnosis: CHF exacerbation [facility]) [[id number]]  Admitting Physician: Reyne Dumas [[id number]]  Attending Physician: Reyne Dumas [[id number]]  Estimated length of stay: past midnight tomorrow  Certification:: I certify this patient will need inpatient services for at least 2 midnights  PT Class (Do Not Modify): Inpatient [101]  PT Acc Code (Do Not Modify): Private [1]       Medical History Past Medical History:  Diagnosis Date  . Arthritis   . Dementia   . Depression   . H/O blood clots   . Hypertension   . Stroke ([place])   . Thyroid disease   . Urinary tract infection     Allergies Allergies  Allergen Reactions  . Iron   . Plaquenil [Hydroxychloroquine Sulfate] Other (See Comments)    Bleached skin    IV Location/Drains/Wounds Patient Lines/Drains/Airways Status   Active Line/Drains/Airways    Name:   Placement date:   Placement time:   Site:   Days:   Peripheral IV 09/16/17 Right Forearm   09/16/17    1613    Forearm   less than 1          Labs/Imaging Results for  orders placed or performed during the hospital encounter of 09/16/17 (from the past 48 hour(s))  Urinalysis, Routine w reflex microscopic     Status: None   Collection Time: 09/16/17  3:02 PM  Result Value Ref Range   Color, Urine YELLOW YELLOW   APPearance CLEAR CLEAR   Specific Gravity, Urine 1.012 1.005 - 1.030   pH 6.0 5.0 - 8.0   Glucose, UA NEGATIVE NEGATIVE mg/dL   Hgb urine dipstick NEGATIVE NEGATIVE   Bilirubin Urine NEGATIVE NEGATIVE   Ketones, ur NEGATIVE NEGATIVE mg/dL   Protein, ur NEGATIVE NEGATIVE mg/dL   Nitrite NEGATIVE NEGATIVE   Leukocytes, UA NEGATIVE NEGATIVE    Comment: Performed at [facility], [place] [address] [place], [place] [address]  Basic metabolic panel     Status: Abnormal   Collection Time: 09/16/17  3:28 PM  Result Value Ref Range   Sodium 137 135 - 145 mmol/L   Potassium 2.9 (L) 3.5 - 5.1 mmol/L   Chloride 99 (L) 101 - 111 mmol/L   CO2 27 22 - 32 mmol/L   Glucose, Bld 131 (H) 65 - 99 mg/dL   BUN 15 6 - 20 mg/dL   Creatinine, Ser 0.89 0.44 - 1.00 mg/dL   Calcium 8.7 (L) 8.9 -  10.3 mg/dL   GFR calc non Af Amer 56 (L) >60 mL/min   GFR calc Af Amer >60 >60 mL/min    Comment: (NOTE) The eGFR has been calculated using the CKD EPI equation. This calculation has not been validated in all clinical situations. eGFR's persistently <60 mL/min signify possible Chronic Kidney Disease.    Anion gap 11 5 - 15    Comment: Performed at [facility], [place] [address], [place], [place] [address]  CBC with Differential     Status: Abnormal   Collection Time: 09/16/17  3:28 PM  Result Value Ref Range   WBC 7.0 4.0 - 10.5 K/uL   RBC 3.80 (L) 3.87 - 5.11 MIL/uL   Hemoglobin 11.7 (L) 12.0 - 15.0 g/dL   HCT 35.2 (L) 36.0 - 46.0 %   MCV 92.6 78.0 - 100.0 fL   MCH 30.8 26.0 - 34.0 pg   MCHC 33.2 30.0 - 36.0 g/dL   RDW 12.9 11.5 - 15.5 %   Platelets 372 150 - 400 K/uL   Neutrophils Relative % 68 %   Neutro Abs 4.7 1.7 - 7.7  K/uL   Lymphocytes Relative 21 %   Lymphs Abs 1.4 0.7 - 4.0 K/uL   Monocytes Relative 10 %   Monocytes Absolute 0.7 0.1 - 1.0 K/uL   Eosinophils Relative 1 %   Eosinophils Absolute 0.1 0.0 - 0.7 K/uL   Basophils Relative 0 %   Basophils Absolute 0.0 0.0 - 0.1 K/uL    Comment: Performed at [facility], [place] [address] [place], [place] [address]  Troponin I     Status: None   Collection Time: 09/16/17  3:28 PM  Result Value Ref Range   Troponin I <0.03 <0.03 ng/mL    Comment: Performed at [facility], [place] [address] [place], [place] [address]  Brain natriuretic peptide     Status: Abnormal   Collection Time: 09/16/17  3:29 PM  Result Value Ref Range   B Natriuretic Peptide 179.4 (H) 0.0 - 100.0 pg/mL    Comment: Performed at [facility], [place] [address] [place], [place] [address]   Dg Chest 2 View  Result Date: 09/16/2017 CLINICAL DATA:  Lower extremity swelling. EXAM: CHEST - 2 VIEW COMPARISON:  None. FINDINGS: The heart is at the upper limits of normal in size. Normal mediastinal contours. Atherosclerotic calcification of the aortic arch. Normal pulmonary vascularity. Elevation of the right hemidiaphragm. No focal consolidation, pleural effusion, or pneumothorax. No acute osseous abnormality. IMPRESSION: 1.  No active cardiopulmonary disease. 2.  Aortic atherosclerosis (ICD10-I70.0). Electronically Signed   By: Titus Dubin M.D.   On: 09/16/2017 15:20    Pending Labs Unresulted Labs (From admission, onward)   Start     Ordered   09/23/17 0500  Creatinine, serum  (enoxaparin (LOVENOX)    CrCl >/= 30 ml/min)  Weekly,   R    Comments:  while on enoxaparin therapy    09/16/17 1700   09/17/17 3419  Basic metabolic panel  Daily,   R     09/16/17 1700   09/16/17 1700  TSH  Add-on,   R     09/16/17 1700   09/16/17 1700  Brain natriuretic peptide  Once,   R     09/16/17 1700   09/16/17 1700  Troponin I  Now then every 6  hours,   R     09/16/17 1700   09/16/17 1659  Magnesium  Add-on,   R     09/16/17 1700   09/16/17 1658  CBC  (enoxaparin (LOVENOX)    CrCl >/= 30 ml/min)  Once,   R    Comments:  Baseline for enoxaparin therapy IF NOT ALREADY DRAWN.  Notify MD if PLT < 100 K.    09/16/17 1700   09/16/17 1658  Creatinine, serum  (enoxaparin (LOVENOX)    CrCl >/= 30 ml/min)  Once,   R    Comments:  Baseline for enoxaparin therapy IF NOT ALREADY DRAWN.    09/16/17 1700   09/16/17 1502  Urine culture  STAT,   STAT     09/16/17 1501      Vitals/Pain Today's Vitals   09/16/17 1218 09/16/17 1412 09/16/17 1630 09/16/17 1630  BP: (!) 147/87 (!) 141/105 (!) 152/100 (!) 152/100  Pulse: 74 80 80 82  Resp: '16 14 12 15  '$ Temp: 98.2 F (36.8 C) 98.1 F (36.7 C)    TempSrc: Oral Oral    SpO2: 100% 97% 96% 96%  Weight: 176 lb (79.8 kg)     Height: '5\' 4"'$  (1.626 m)       Isolation Precautions No active isolations  Medications Medications  potassium chloride SA (K-DUR,KLOR-CON) CR tablet 40 mEq (has no administration in time range)  sodium chloride flush (NS) 0.9 % injection 3 mL (has no administration in time range)  sodium chloride flush (NS) 0.9 % injection 3 mL (has no administration in time range)  0.9 %  sodium chloride infusion (has no administration in time range)  acetaminophen (TYLENOL) tablet 650 mg (has no administration in time range)  ondansetron (ZOFRAN) injection 4 mg (has no administration in time range)  enoxaparin (LOVENOX) injection 40 mg (has no administration in time range)  furosemide (LASIX) injection 60 mg (has no administration in time range)  potassium chloride (K-DUR,KLOR-CON) CR tablet 30 mEq (has no administration in time range)  furosemide (LASIX) injection 40 mg (40 mg Intravenous Given 09/16/17 1614)    Mobility walks with device

## 2017-09-16 NOTE — ED Notes (Signed)
[place] tech by pt bedside.

## 2017-09-16 NOTE — ED Provider Notes (Signed)
[facility]-EMERGENCY DEPT Provider Note   CSN: [id number] Arrival date & time: 09/16/17  1214     History   Chief Complaint Chief Complaint  Patient presents with  . Leg Swelling    HPI Cassandra Patton is a 84 y.o. female with a hx of stroke, dementia, depression, HTN, and hypothyroidism who presents to the ED at request of her PCP, Dr. Salomon Fick, for lower extremity edema that has been occurring since 09/03/17. Patient states that she developed bilateral lower extremity swelling about 3 weeks ago. She was seen by her PCP 09/03/17- had labs drawn (CBC, BMP, BNP- fairly unremarkable- BNP 112)- given 3 day course of 20 mg Lasix for treatment, no improvement, therefore she returned to PCP 05/17- was given an additional 10 day prescription for 20 mg Lasix and her Amlodipine was decreased to 5 mg dose due to concern for this possibly being a contributing factor. Today she returned to see her PCP due to lack of improvement, taking Lasix as prescribed. She mentioned to PCP that she has DOE and sometimes find it difficult to lay flat at night. Dr. Salomon Fick, note mentions concern for CHF, recommended patient present to the ED for further evaluation and what she felt was likely admission for excess fluid removal.    Patient and her nephew at bedside state LE swelling is constant, not necessarily worse/better since it started. State that she had a hx of similar several years ago. She states that her legs are somewhat painful with the swelling, L>R. She does mention that she is short of breath when she exerts herself, +/- orthopnea. She did travel in a car for 6 hours shortly prior to onset of LE swelling. Denies hemoptysis, recent surgery/trauma, hormone use, personal hx of cancer, or hx of DVT/PE- chart review does note hx of blood clots- I specifically asked family and the patient about this and they denied prior DVT/PE. Denies chest pain, nausea, vomiting, lightheadedness, dizziness, or  syncope. No prior hx of HF.    HPI  Past Medical History:  Diagnosis Date  . Arthritis   . Dementia   . Depression   . H/O blood clots   . Hypertension   . Stroke (HCC)   . Thyroid disease   . Urinary tract infection     Patient Active Problem List   Diagnosis Date Noted  . Dementia with behavioral disturbance 09/16/2017  . Bilateral lower extremity edema 09/16/2017  . Anxiety and depression 09/16/2017  . Hypothyroidism 09/16/2017  . History of CVA (cerebrovascular accident) 09/16/2017  . History of blood clots 09/16/2017    History reviewed. No pertinent surgical history.   OB History   None      Home Medications    Prior to Admission medications   Medication Sig Start Date End Date Taking? Authorizing Provider  amLODipine (NORVASC) 5 MG tablet Take 1 tablet (5 mg total) by mouth daily. 09/13/17  Yes Deeann Saint, MD  FLUoxetine (PROZAC) 20 MG tablet Take 1 tablet (20 mg total) by mouth daily. 09/13/17  Yes Deeann Saint, MD  furosemide (LASIX) 20 MG tablet Take 1 tablet (20 mg total) by mouth daily. 09/10/17  Yes Nafziger, Kandee Keen, NP  levothyroxine (SYNTHROID, LEVOTHROID) 50 MCG tablet Take 1 tablet (50 mcg total) by mouth daily before breakfast. 09/13/17  Yes Deeann Saint, MD  LORazepam (ATIVAN) 0.5 MG tablet Take 0.5 mg by mouth every 8 (eight) hours.   Yes [provider]  Melatonin 10  MG TABS Take 10 mg by mouth at bedtime.   Yes [provider]  metoprolol tartrate (LOPRESSOR) 25 MG tablet Take 1 tablet (25 mg total) by mouth 2 (two) times daily. 09/13/17  Yes Deeann Saint, MD  Multiple Vitamins-Minerals (EYE VITAMINS PO) Take 1 capsule by mouth daily.    Yes [provider]  QUEtiapine (SEROQUEL) 25 MG tablet Take 25 mg by mouth at bedtime.   Yes [provider]    Family History No family history on file.  Social History Social History   Tobacco Use  . Smoking status: Never Smoker  . Smokeless tobacco:  Never Used  Substance Use Topics  . Alcohol use: Never    Frequency: Never  . Drug use: Never     Allergies   Iron and Plaquenil [hydroxychloroquine sulfate]   Review of Systems Review of Systems  Constitutional: Negative for chills and fever.  Respiratory: Positive for shortness of breath (with exertion).   Cardiovascular: Positive for leg swelling. Negative for chest pain.  Gastrointestinal: Negative for abdominal pain, diarrhea, nausea and vomiting.  Genitourinary: Negative for dysuria.  Musculoskeletal: Positive for myalgias (LEs, L> R).  Neurological: Negative for syncope, light-headedness and numbness.  All other systems reviewed and are negative.   Physical Exam Updated Vital Signs BP (!) 147/87 (BP Location: Left Arm)   Pulse 74   Temp 98.2 F (36.8 C) (Oral)   Resp 16   Ht  (1.626 m)   Wt 79.8 kg (176 lb)   SpO2 100%   BMI 30.21 kg/m   Physical Exam  Constitutional: She appears well-developed and well-nourished.  Non-toxic appearance. No distress.  HENT:  Head: Normocephalic and atraumatic.  Eyes: Conjunctivae are normal. Right eye exhibits no discharge. Left eye exhibits no discharge.  Cardiovascular: Normal rate and regular rhythm.  No murmur heard. Pulses:      Dorsalis pedis pulses are 2+ on the right side, and 2+ on the left side.  Pulmonary/Chest: Effort normal and breath sounds normal. No respiratory distress. She has no wheezes. She has no rhonchi. She has no rales.  Abdominal: Soft. She exhibits no distension. There is no tenderness.  Musculoskeletal:  Lower extremities: 3+ pitting edema which appears fairly symmetric to the feet- this extends and is decreased with ascension to just above the knee. She has minimal tenderness to the R calf, some mild/moderate tenderness to the left calf. Minimal erythema to overlying bilateral shin areas.   Neurological: She is alert.  Clear speech.  Sensation grossly intact to bilateral lower extremities.   Patient has 5 out of 5 symmetric strength with plantar and dorsiflexion.  She ambulates with a walker.  Skin: Skin is warm and dry. No rash noted.  Psychiatric: She has a normal mood and affect. Her behavior is normal.  Nursing note and vitals reviewed.  ED Treatments / Results  Labs Results for orders placed or performed during the hospital encounter of 09/16/17  Basic metabolic panel  Result Value Ref Range   Sodium 137 135 - 145 mmol/L   Potassium 2.9 (L) 3.5 - 5.1 mmol/L   Chloride 99 (L) 101 - 111 mmol/L   CO2 27 22 - 32 mmol/L   Glucose, Bld 131 (H) 65 - 99 mg/dL   BUN 15 6 - 20 mg/dL   Creatinine, Ser 1.61 0.44 - 1.00 mg/dL   Calcium 8.7 (L) 8.9 - 10.3 mg/dL   GFR calc non Af Amer 56 (L) >60 mL/min   GFR  calc Af Amer >60 >60 mL/min   Anion gap 11 5 - 15  CBC with Differential  Result Value Ref Range   WBC 7.0 4.0 - 10.5 K/uL   RBC 3.80 (L) 3.87 - 5.11 MIL/uL   Hemoglobin 11.7 (L) 12.0 - 15.0 g/dL   HCT 16.1 (L) 09.6 - 04.5 %   MCV 92.6 78.0 - 100.0 fL   MCH 30.8 26.0 - 34.0 pg   MCHC 33.2 30.0 - 36.0 g/dL   RDW 40.9 81.1 - 91.4 %   Platelets 372 150 - 400 K/uL   Neutrophils Relative % 68 %   Neutro Abs 4.7 1.7 - 7.7 K/uL   Lymphocytes Relative 21 %   Lymphs Abs 1.4 0.7 - 4.0 K/uL   Monocytes Relative 10 %   Monocytes Absolute 0.7 0.1 - 1.0 K/uL   Eosinophils Relative 1 %   Eosinophils Absolute 0.1 0.0 - 0.7 K/uL   Basophils Relative 0 %   Basophils Absolute 0.0 0.0 - 0.1 K/uL  Brain natriuretic peptide  Result Value Ref Range   B Natriuretic Peptide 179.4 (H) 0.0 - 100.0 pg/mL  Troponin I  Result Value Ref Range   Troponin I <0.03 <0.03 ng/mL  Urinalysis, Routine w reflex microscopic  Result Value Ref Range   Color, Urine YELLOW YELLOW   APPearance CLEAR CLEAR   Specific Gravity, Urine 1.012 1.005 - 1.030   pH 6.0 5.0 - 8.0   Glucose, UA NEGATIVE NEGATIVE mg/dL   Hgb urine dipstick NEGATIVE NEGATIVE   Bilirubin Urine NEGATIVE NEGATIVE   Ketones, ur  NEGATIVE NEGATIVE mg/dL   Protein, ur NEGATIVE NEGATIVE mg/dL   Nitrite NEGATIVE NEGATIVE   Leukocytes, UA NEGATIVE NEGATIVE   EKG EKG Interpretation  Date/Time:  Thursday Sep 16 2017 16:01:42 EDT Ventricular Rate:  79 PR Interval:    QRS Duration: 88 QT Interval:  482 QTC Calculation: 553 R Axis:   90 Text Interpretation:  Sinus rhythm Borderline right axis deviation Borderline low voltage, extremity leads Nonspecific T abnormalities, lateral leads Prolonged QT interval No STEMI.  Confirmed by Alona Bene [phone]) on 09/16/2017 4:17:09 PM   Radiology Dg Chest 2 View  Result Date: 09/16/2017 CLINICAL DATA:  Lower extremity swelling. EXAM: CHEST - 2 VIEW COMPARISON:  None. FINDINGS: The heart is at the upper limits of normal in size. Normal mediastinal contours. Atherosclerotic calcification of the aortic arch. Normal pulmonary vascularity. Elevation of the right hemidiaphragm. No focal consolidation, pleural effusion, or pneumothorax. No acute osseous abnormality. IMPRESSION: 1.  No active cardiopulmonary disease. 2.  Aortic atherosclerosis (ICD10-I70.0). Electronically Signed   By: Obie Dredge M.D.   On: 09/16/2017 15:20    Procedures Procedures (including critical care time)  Medications Ordered in ED Medications - No data to display   Initial Impression / Assessment and Plan / ED Course  I have reviewed the triage vital signs and the nursing notes.  Pertinent labs & imaging results that were available during my care of the patient were reviewed by me and considered in my medical decision making (see chart for details).   Patient presents the emergency department for lower extremity edema at the request of her primary care physician for likely admission for diuresis and heart failure work-up.  Patient nontoxic-appearing, in no apparent distress, vitals notable for hypertension, otherwise within normal limits, do not suspect hypertensive emergency.  Patient's exam is notable  for lower extremity edema which extends just above the knee.  She does have some calf tenderness  which is more substantial on the left.  She is neurovascularly intact distally.  She does endorse some shortness of breath with exertion, PCP noted orthopena, her lungs are clear to auscultation bilaterally.  Will initiate treatment with 40 mg IV lasix. Will evaluate with basic labs, BNP, troponin, EKG, CXR. Will also obtain LLE duplex to evaluate for DVT. Minimal overlying erythema, no warmth, patient is afebrile, doubt cellulitis. Discussed with supervising physician Dr. Jacqulyn Bath who is in agreement with plan.   EKG without acute STEMI, troponin WNL, doubt ACS. CXR without active cardiopulmonary disease. Her labs are notable for hypokalemia- will orally replace in the ER. BNP 179.4. Left lower extremity US negative for DVT. Will consult hospitalist for admission for diuresis and possible echocardiogram throughout stay.   16:33: CONSULT: Discussed case with hospitalist Dr. Lynford Citizen who accepts admission  Discussed with patient's nephew via phone who is POA- in agreement with plan.   Findings and plan of care discussed with supervising physician Dr. Jacqulyn Bath who personally evaluated and examined this patient and is in agreement with plan.    Final Clinical Impressions(s) / ED Diagnoses   Final diagnoses:  Bilateral lower extremity edema    ED Discharge Orders    None       Cherly Anderson, PA-C 09/16/17 1739    Maia Plan, MD 09/17/17 1036

## 2017-09-16 NOTE — Patient Instructions (Signed)
Given your worsened lower extremity edema it is advised that your proceed to the Emergency Department for further evaluation and possible admission so that you can be closely monitored while removing the fluid from your legs.

## 2017-09-16 NOTE — ED Triage Notes (Signed)
Pt brought in for increased leg swelling that isnt getting any better with PO Lasix. Patient swelling been getting worse over past 10 days per family.

## 2017-09-16 NOTE — Progress Notes (Signed)
Subjective:    Patient ID: Cassandra Patton, female    DOB: July 21, 1925, 84 y.o.   MRN: [id number]  No chief complaint on file. 84 yo female with pmh sig for arthritis, dementia with behavioral disturbances, anxiety, depression, history of blood clots, HTN, stroke, hypothyroidism.  Pt accompanied by her nephew.  Pt is a resident at [facility].  HPI Patient was seen today for follow-up  on lower extremity edema.  Pt was previously seen on 09/03/2017 for UTI and lower extremity edema.  BNP was 112.  Pt was subsequently started on Macrobid and a 3 day course of Lasix 20 mg.  Pt then seen on 5/17 for continued LE edema at which time Lasix 20 mg was continued.  As pt's Norvasc 10 mg could have contributed to her edema the dose was decreased to 5 mg.  Pt also on Metoprolol 25 mg BID.   Pt returns today with worsening lower extremity edema that is painful.  Pt states Lasix makes it hard for her to make it to the restroom.  Pt has been elevating her feet when sitting.  She does not like TED hose.  Pt states at times it is difficult to lay flat at night, endorses SOB with exertion-but unable to elaborate.  Past Medical History:  Diagnosis Date  . Arthritis   . Dementia   . Depression   . H/O blood clots   . Hypertension   . Stroke (HCC)   . Thyroid disease   . Urinary tract infection     Allergies  Allergen Reactions  . Iron   . Plaquenil [Hydroxychloroquine Sulfate] Other (See Comments)    Bleached skin    ROS General: Denies fever, chills, night sweats, changes in weight, changes in appetite HEENT: Denies headaches, ear pain, changes in vision, rhinorrhea, sore throat CV: Denies CP, palpitations, SOB, orthopnea   + B/l LE edema Pulm: Denies SOB, cough, wheezing GI: Denies abdominal pain, nausea, vomiting, diarrhea, constipation GU: Denies dysuria, hematuria, frequency, vaginal discharge Msk: Denies muscle cramps, joint pains Neuro: Denies weakness, numbness, tingling Skin: Denies  rashes, bruising Psych: Denies hallucinations  + depression, anxiety, dementia     Objective:    Blood pressure (!) 138/58, pulse 74, temperature 98.4 F (36.9 C), temperature source Oral, weight 176 lb (79.8 kg), SpO2 96 %.   Gen. Pleasantly confused, anxious, well-nourished, in no distress, normal affect   HEENT: [place]/AT, face symmetric, no scleral icterus, PERRLA, nares patent without drainage Lungs: no accessory muscle use, CTAB, no wheezes or rales Cardiovascular: RRR, no m/r/g, 2+ pitting edema in feet and ankles b/l  Neuro:  A&Ox2, CN II-XII grossly intact, using rollator for assistance with ambulation Skin:  Warm, dry, intact.  Skin of b/l lower legs taught, TTP, with a few areas of erythema/irritation.   Wt Readings from Last 3 Encounters:  09/16/17 176 lb (79.8 kg)  09/10/17 172 lb (78 kg)  09/03/17 171 lb (77.6 kg)    Lab Results  Component Value Date   WBC 9.9 09/03/2017   HGB 12.8 09/03/2017   HCT 38.3 09/03/2017   PLT 244.0 09/03/2017   GLUCOSE 95 09/03/2017   NA 142 09/03/2017   K 4.1 09/03/2017   CL 102 09/03/2017   CREATININE 0.98 09/03/2017   BUN 23 09/03/2017   CO2 28 09/03/2017   TSH 3.28 09/03/2017    Assessment/Plan:  Bilateral lower extremity edema -Worsening lower extremity edema despite Lasix 20 mg daily. -Concern for CHF given slightly elevated BNP  on 09/03/17, 5 lbs weight gain and pt's inability to lay flat at night. -Discussed with pt and family member need to be monitored while having excess fluid removed. -Patient advised to proceed to the hospital [facility] ED for further evaluation and likely admission.  Dementia with behavioral disturbance, unspecified dementia type -Recent hospitalization at [facility] in [place], [place] -Family advised to find a local psychiatrist.  Rufina Falco takes walk ins. -advised of increased risk of falls when using Benzos in older populations.  F/u  prn  Abbe Amsterdam, MD

## 2017-09-17 ENCOUNTER — Inpatient Hospital Stay (HOSPITAL_COMMUNITY): Payer: Medicare Other

## 2017-09-17 DIAGNOSIS — I361 Nonrheumatic tricuspid (valve) insufficiency: Secondary | ICD-10-CM

## 2017-09-17 LAB — BASIC METABOLIC PANEL
Anion gap: 12 (ref 5–15)
BUN: 13 mg/dL (ref 6–20)
CHLORIDE: 99 mmol/L — AB (ref 101–111)
CO2: 28 mmol/L (ref 22–32)
CREATININE: 0.92 mg/dL (ref 0.44–1.00)
Calcium: 8.5 mg/dL — ABNORMAL LOW (ref 8.9–10.3)
GFR calc Af Amer: >60 mL/min (ref >60)
GFR calc non Af Amer: 54 mL/min — ABNORMAL LOW (ref >60)
Glucose, Bld: 106 mg/dL — ABNORMAL HIGH (ref 65–99)
Potassium: 3.9 mmol/L (ref 3.5–5.1)
SODIUM: 139 mmol/L (ref 135–145)

## 2017-09-17 LAB — URINE CULTURE: Culture: NO GROWTH

## 2017-09-17 LAB — ECHOCARDIOGRAM COMPLETE
Height: 64 in
Weight: 2744.29 oz

## 2017-09-17 LAB — TROPONIN I

## 2017-09-17 MED ORDER — LEVOTHYROXINE SODIUM 75 MCG PO TABS
75.0000 ug | ORAL_TABLET | Freq: Every day | ORAL | Status: DC
  Administered 2017-09-17 – 2017-09-22 (×6): 75 ug via ORAL
  Filled 2017-09-17 (×6): qty 1

## 2017-09-17 NOTE — Clinical Social Work Note (Signed)
Clinical Social Work Assessment  Patient Details  Name: Cassandra Patton MRN: [id number] Date of Birth: 08-06-1925  Date of referral:  09/17/17               Reason for consult:  Facility Placement, Discharge Planning                Permission sought to share information with:  Guardian, Family Supports Permission granted to share information::     Name::        Agency::  SNF Placement   Relationship::   Legal Guardian Niece   Contact Information:     Housing/Transportation Living arrangements for the past 2 months:  Independent Dealer of Information:  Other (Comment Required), Guardian Patient Interpreter Needed:  None Criminal Activity/Legal Involvement Pertinent to Current Situation/Hospitalization:  No - Comment as needed Significant Relationships:  Other Family Members(Legal Guardian-Niece Robin) Lives with:  Facility Resident Do you feel safe going back to the place where you live?  Yes Need for family participation in patient care:  Yes (patient has dementia)  Care giving concerns:   Patient admitted for shortness of breath.   Social Worker assessment / plan: Patient has advanced dementia and unable to participate in assessment.  CSW called Robin-listed as legal guardian (Niece). She reports she and her spouse recently relocated the patient from [place] to [place]. She currently resides at [facility] facility. She reports the patient has additional services at the facility, such as medication management and aides. Niece reports she had arranged for the patient to start [facility] with [facility] but the patient was admitted to hospital.  She reports the patient mental status and physical abilities have declined since January. She reports last November and December the patient was independent and still driving.  Niece reports the family wants to support her and figure out how they can best meet the patient needs.  CSW explain  PT evaluation and recommendations to assist determine patient disposition.   Plan: Assist with discharge to SNF.   Employment status:  Retired Health and safety inspector:  Medicare PT Recommendations:  Not assessed at this time(Pending) Information / Referral to community resources:     Patient/Family's Response to care:  Agreeable and Responding well to care.   Patient/Family's Understanding of and Emotional Response to Diagnosis, Current Treatment, and Prognosis:  Patient has dementia. Patient niece is very involved in patient care and has a good understanding of diagnosis and current treatment. She has been proactive in learning and arranging services for the patient to best meet her needs.   Emotional Assessment Appearance:  Appears stated age Attitude/Demeanor/Rapport:    Affect (typically observed):  Accepting Orientation:  Oriented to Self Alcohol / Substance use:  Not Applicable Psych involvement (Current and /or in the community):  No (Comment)  Discharge Needs  Concerns to be addressed:  Discharge Planning Concerns Readmission within the last 30 days:  No Current discharge risk:  Cognitively Impaired Barriers to Discharge:  Continued Medical Work up   [employer], LCSW 09/17/2017, 11:34 AM

## 2017-09-17 NOTE — Progress Notes (Signed)
PROGRESS NOTE    Cassandra Patton  WUJ:[id number] DOB: [birth year] DOA: 09/16/2017 PCP: Deeann Saint, MD   Brief Narrative: Patient is a 84 year old female with past medical history of depression, dementia, hypertension, hypothyroidism who presents to the emergency department with worsening lower extremity edema.  Patient reported to have chronic lower extremity edema with weeping, inability to walk, abdominal distention orthopnea worsening since last 3 months.  Patient was taking oral Lasix at home without much improvement.  Chest x-ray did not show any  acute cardiopulmonary abnormalities on presentation. Patient admitted for the management of CHF exacerbation.  Assessment & Plan:   Principal Problem:   CHF exacerbation (HCC)  CHF exacerbation: No known history of CHF in the past.  Presented with worsening severe bilateral lower extremity edema .Started on IV diuresis.  Echocardiogram done here shows normal left ventricle ejection fraction, no wall motion abnormalities, grade 1 diastolic dysfunction.BNP 226. We will elevate her legs, continue diuresis, compression stockings for severe peripheral edema the lower extremities.  Chest x-ray on presentation did not show any active disease.  EKG without any ischemic changes. TSH mildly elevated.  Increase the dose of Synthroid 50 to 75 mcg.  Wound care consulted for weeping lower extremity skin superficial ulcers.  Hypertension: Amlodipine will be stopped  permanently for lower extremity edema.  Continue metoprolol   Hypothyroidism: Continue Synthyroid at 75 mcg.  Dementia/depression: Continue Prozac, lorazepam, Seroquel  Deconditioning/debility: Requested physical therapy for evaluation   DVT prophylaxis: Lovenox Code Status: Full Family Communication: None present at the bedside Disposition Plan: Likely home in 1 to 2 days   Consultants: None   Procedures: Echocardiogram  Antimicrobials: None  Subjective: Patient seen and  examined the bedside this morning.  Remains comfortable.  Has bilateral lower extremity edema which is quite severe.  Her lungs are clear on examination.  She is not in any kind of respiratory distress.  Objective: Vitals:   09/16/17 1630 09/16/17 1823 09/16/17 2143 09/17/17 0547  BP: (!) 152/100 108/83 132/70 (!) 144/56  Pulse: 82 81 74 73  Resp: Temp:  99.1 F (37.3 C) 98.6 F (37 C) 97.8 F (36.6 C)  TempSrc:  Oral    SpO2: 96% 93% 96% 95%  Weight:    77.8 kg (171 lb 8.3 oz)  Height:        Intake/Output Summary (Last 24 hours) at 09/17/2017 1338 Last data filed at 09/17/2017 1332 Gross per 24 hour  Intake 120 ml  Output 3150 ml  Net -3030 ml   Filed Weights   09/16/17 1218 09/17/17 0547  Weight: 79.8 kg (176 lb) 77.8 kg (171 lb 8.3 oz)    Examination:  General exam: Appears calm and comfortable ,Not in distress,average built HEENT:PERRL,Oral mucosa moist, Ear/Nose normal on gross exam Respiratory system: Bilateral equal air entry, normal vesicular breath sounds, no wheezes or crackles  Cardiovascular system: S1 & S2 heard, RRR. No JVD, murmurs, rubs, gallops or clicks. severe pedal edema. Gastrointestinal system: Abdomen is nondistended, soft and nontender. No organomegaly or masses felt. Normal bowel sounds heard. Central nervous system: Alert and oriented. No focal neurological deficits. Extremities: severe bilateral lower extremity edema, no clubbing ,no cyanosis, distal peripheral pulses palpable. Skin: No rashes, lesions or ulcers,no icterus ,no pallor MSK: Normal muscle bulk,tone ,power Psychiatry: Judgement and insight appear normal. Mood & affect appropriate.     Data Reviewed: I have personally reviewed following labs and imaging studies  CBC: Recent Labs  Lab  09/16/17 1528 09/16/17 1839  WBC 7.0 7.9  NEUTROABS 4.7  --   HGB 11.7* 12.7  HCT 35.2* 37.2  MCV 92.6 93.5  PLT 372 383   Basic Metabolic Panel: Recent Labs  Lab  09/16/17 1528 09/16/17 1839 09/17/17 0503  NA 137  --  139  K 2.9*  --  3.9  CL 99*  --  99*  CO2 27  --  28  GLUCOSE 131*  --  106*  BUN 15  --  13  CREATININE 0.89 0.88 0.92  CALCIUM 8.7*  --  8.5*  MG  --  1.8  --    GFR: Estimated Creatinine Clearance: 41.8 mL/min (by C-G formula based on SCr of 0.92 mg/dL). Liver Function Tests: No results for input(s): AST, ALT, ALKPHOS, BILITOT, PROT, ALBUMIN in the last 168 hours. No results for input(s): LIPASE, AMYLASE in the last 168 hours. No results for input(s): AMMONIA in the last 168 hours. Coagulation Profile: No results for input(s): INR, PROTIME in the last 168 hours. Cardiac Enzymes: Recent Labs  Lab 09/16/17 1528 09/16/17 1839 09/16/17 2249 09/17/17 0503  TROPONINI <0.03 <0.03 <0.03 <0.03   BNP (last 3 results) Recent Labs    09/03/17 1225  PROBNP 112.0*   HbA1C: No results for input(s): HGBA1C in the last 72 hours. CBG: No results for input(s): GLUCAP in the last 168 hours. Lipid Profile: No results for input(s): CHOL, HDL, LDLCALC, TRIG, CHOLHDL, LDLDIRECT in the last 72 hours. Thyroid Function Tests: Recent Labs    09/16/17 1536  TSH 6.803*   Anemia Panel: No results for input(s): VITAMINB12, FOLATE, FERRITIN, TIBC, IRON, RETICCTPCT in the last 72 hours. Sepsis Labs: No results for input(s): PROCALCITON, LATICACIDVEN in the last 168 hours.  Recent Results (from the past 240 hour(s))  MRSA PCR Screening     Status: None   Collection Time: 09/16/17  6:55 PM  Result Value Ref Range Status   MRSA by PCR NEGATIVE NEGATIVE Final    Comment:        The GeneXpert MRSA Assay (FDA approved for NASAL specimens only), is one component of a comprehensive MRSA colonization surveillance program. It is not intended to diagnose MRSA infection nor to guide or monitor treatment for MRSA infections. Performed at [facility], [address] [place], [place] [address]           Radiology Studies: Dg Chest 2 View  Result Date: 09/16/2017 CLINICAL DATA:  Lower extremity swelling. EXAM: CHEST - 2 VIEW COMPARISON:  None. FINDINGS: The heart is at the upper limits of normal in size. Normal mediastinal contours. Atherosclerotic calcification of the aortic arch. Normal pulmonary vascularity. Elevation of the right hemidiaphragm. No focal consolidation, pleural effusion, or pneumothorax. No acute osseous abnormality. IMPRESSION: 1.  No active cardiopulmonary disease. 2.  Aortic atherosclerosis (ICD10-I70.0). Electronically Signed   By: Obie Dredge M.D.   On: 09/16/2017 15:20        Scheduled Meds: . enoxaparin (LOVENOX) injection  40 mg Subcutaneous Q24H  . FLUoxetine  20 mg Oral Daily  . furosemide  60 mg Intravenous Q12H  . levothyroxine  75 mcg Oral QAC breakfast  . LORazepam  0.5 mg Oral Q8H  . metoprolol tartrate  25 mg Oral BID  . potassium chloride  30 mEq Oral TID  . QUEtiapine  25 mg Oral QHS  . sodium chloride flush  3 mL Intravenous Q12H   Continuous Infusions: . sodium chloride  LOS: 1 day    Time spent: 35 mins.More than 50% of that time was spent in counseling and/or coordination of care.      Burnadette Pop, MD [facility] Pager [phone]  If 7PM-7AM, please contact night-coverage [email or web address] Password [facility] 09/17/2017, 1:38 PM

## 2017-09-17 NOTE — Progress Notes (Addendum)
CSW met niece and niece spouse to discuss PT recommendations for SNF. Both are agreeable to SNF placement and thing the patient can benefit from a few weeks of rehab. They are hopeful the patient can go back to [facility] if they will accept, she was there about a month ago. CSW provide blank SNF list for niece to reference and choose other options if [facility]-snf does not have any availability at the time of discharge.  CSW initiated  patient state PASRR screening it is under manuel review due to patient depression diagnosis.  CSW will continue to assist patient and  family with disposition needs.   Barriers of discharge: [place] Physician informed.   SW will continue to assist with discharge.   Kathrin Greathouse, Latanya Presser, MSW Clinical Social Worker  [phone] 09/17/2017  4:37 PM

## 2017-09-17 NOTE — Consult Note (Signed)
WOC Nurse wound consult note Reason for Consult: weeping LEs Wound type: intact bulla over the pretibial region related to volume overload Pressure Injury POA: NA Measurement: scattered intact bulla; LLE 2 areas 2cm in diameter, not weeping at the time of my assessment; 3-4 areas blisters not open 1cm in diameter  Wound bed: see above  Drainage (amount, consistency, odor) none Periwound: redness but does not appear to be acute cellulitis Palpable pulses bilaterally.  Dressing procedure/placement/frequency: Silicone foam to protect the bulla under TED hose (ordered per MD).   Consider ABI for safety for compression wraps long term. Would suggest Unnas' boots after ABI resulted. Could be followed up in the outpatient setting.    Discussed POC with patient and bedside nurse.  Re consult if needed, will not follow at this time. Thanks  Marygrace Sandoval [employer], RN,CWOCN, CNS, CWON-AP [phone])

## 2017-09-17 NOTE — Evaluation (Signed)
Physical Therapy Evaluation Patient Details Name: Cassandra Patton MRN: [id number] DOB: 02-Jul-1925 Today's Date: 09/17/2017   History of Present Illness  84 yo female admitted with CHF exac, bil LE edema. Hx of dementia, CVA, OA, LE edema  Clinical Impression  On eval, pt required Min assist for mobility. She walked ~5 feet with a RW. Total assist required for hygiene-bowel incontinence. Pt presents with general weakness, decreased activity tolerance, and impaired gait and balance. No family present during session. Pt has a hx of dementia. Per chart, she is from [employer]. Unsure of the level of assistance available to pt or pt's PLOF. Pt could potentially benefit from a short rehab stay prior to returning to her independent living apt. if pt and family are agreeable. Will continue to follow and progress activity as tolerated.     Follow Up Recommendations SNF (prior to returning to Ind Living)    Equipment Recommendations  None recommended by PT    Recommendations for Other Services       Precautions / Restrictions Precautions Precautions: Fall Precaution Comments: incontinent Restrictions Weight Bearing Restrictions: No      Mobility  Bed Mobility Overal bed mobility: Needs Assistance Bed Mobility: Supine to Sit     Supine to sit: Min guard;HOB elevated     General bed mobility comments: close guard for safety. Increased time.   Transfers Overall transfer level: Needs assistance Equipment used: Rolling walker (2 wheeled) Transfers: Sit to/from [employer] Sit to Stand: Min assist Stand pivot transfers: Min guard       General transfer comment: Assist to rise, stabilize, control desent. VCs hand placement. Stand pivot, bed to bsc, with RW.   Ambulation/Gait Ambulation/Gait assistance: Min assist Ambulation Distance (Feet): 5 Feet Assistive device: Rolling walker (2 wheeled) Gait Pattern/deviations: Step-through pattern;Trunk  flexed     General Gait Details: Assist to maneuver with walker in close quarters. Pt did well. Unable to ambulate in hall on today due to incontinence.   Stairs            Wheelchair Mobility    Modified Rankin (Stroke Patients Only)       Balance Overall balance assessment: Needs assistance         Standing balance support: Bilateral upper extremity supported Standing balance-Leahy Scale: Poor                               Pertinent Vitals/Pain Pain Assessment: No/denies pain    Home Living   Living Arrangements: Alone   Type of Home: Independent living facility([facility])         Home Equipment: Walker - 2 wheels      Prior Function           Comments: uses walker for ambulation     Hand Dominance        Extremity/Trunk Assessment   Upper Extremity Assessment Upper Extremity Assessment: Generalized weakness    Lower Extremity Assessment Lower Extremity Assessment: Generalized weakness    Cervical / Trunk Assessment Cervical / Trunk Assessment: Kyphotic  Communication   Communication: HOH  Cognition Arousal/Alertness: Awake/alert Behavior During Therapy: WFL for tasks assessed/performed Overall Cognitive Status: History of cognitive impairments - at baseline                                 General Comments: hx of  dementia per chart      General Comments      Exercises     Assessment/Plan    PT Assessment Patient needs continued PT services  PT Problem List Decreased balance;Decreased mobility;Decreased strength;Decreased activity tolerance;Decreased cognition       PT Treatment Interventions DME instruction;Gait training;Functional mobility training;Therapeutic activities;Balance training;Patient/family education;Therapeutic exercise    PT Goals (Current goals can be found in the Care Plan section)  Acute Rehab PT Goals Patient Stated Goal: none stated PT Goal Formulation: Patient  unable to participate in goal setting Time For Goal Achievement: 10/01/17 Potential to Achieve Goals: Good    Frequency Min 3X/week   Barriers to discharge        Co-evaluation               AM-PAC PT "6 Clicks" Daily Activity  Outcome Measure Difficulty turning over in bed (including adjusting bedclothes, sheets and blankets)?: A Little Difficulty moving from lying on back to sitting on the side of the bed? : A Little Difficulty sitting down on and standing up from a chair with arms (e.g., wheelchair, bedside commode, etc,.)?: Unable Help needed moving to and from a bed to chair (including a wheelchair)?: A Little Help needed walking in hospital room?: A Little Help needed climbing 3-5 steps with a railing? : A Lot 6 Click Score: 15    End of Session Equipment Utilized During Treatment: Gait belt Activity Tolerance: Patient tolerated treatment well Patient left: in chair;with call bell/phone within reach;with chair alarm set   PT Visit Diagnosis: Difficulty in walking, not elsewhere classified (R26.2);Muscle weakness (generalized) (M62.81)    Time: 1413-1430 PT Time Calculation (min) (ACUTE ONLY): 17 min   Charges:   PT Evaluation $PT Eval Moderate Complexity: 1 Mod     PT G Codes:          Rebeca Alert, MPT Pager: [phone]

## 2017-09-17 NOTE — Progress Notes (Signed)
  Echocardiogram 2D Echocardiogram has been performed.  [name] 09/17/2017, 12:10 PM

## 2017-09-18 LAB — BASIC METABOLIC PANEL
Anion gap: 10 (ref 5–15)
BUN: 17 mg/dL (ref 6–20)
CALCIUM: 8.8 mg/dL — AB (ref 8.9–10.3)
CO2: 31 mmol/L (ref 22–32)
CREATININE: 1.08 mg/dL — AB (ref 0.44–1.00)
Chloride: 96 mmol/L — ABNORMAL LOW (ref 101–111)
GFR, EST AFRICAN AMERICAN: 51 mL/min — AB (ref >60)
GFR, EST NON AFRICAN AMERICAN: 44 mL/min — AB (ref >60)
GLUCOSE: 111 mg/dL — AB (ref 65–99)
Potassium: 4.2 mmol/L (ref 3.5–5.1)
Sodium: 137 mmol/L (ref 135–145)

## 2017-09-18 MED ORDER — FUROSEMIDE 20 MG PO TABS
20.0000 mg | ORAL_TABLET | Freq: Every day | ORAL | Status: DC
  Administered 2017-09-19: 20 mg via ORAL
  Filled 2017-09-18: qty 1

## 2017-09-18 MED ORDER — FUROSEMIDE 10 MG/ML IJ SOLN: 40.0000 mg | Freq: Every day | INTRAMUSCULAR | Status: DC

## 2017-09-18 MED ORDER — POTASSIUM CHLORIDE CRYS ER 20 MEQ PO TBCR
20.0000 meq | EXTENDED_RELEASE_TABLET | Freq: Every day | ORAL | Status: DC
  Administered 2017-09-19 – 2017-09-22 (×4): 20 meq via ORAL
  Filled 2017-09-18 (×4): qty 1

## 2017-09-18 NOTE — NC FL2 (Signed)
[place] MEDICAID FL2 LEVEL OF CARE SCREENING TOOL     IDENTIFICATION  Patient Name: Cassandra Patton Birthdate: Sep 06, 1925 Sex: female Admission Date (Current Location): 09/16/2017  [facility] and [place] Number:  Producer, television/film/video and Address:         Provider Number: [id number]  Attending Physician Name and Address:  Burnadette Pop, MD  Relative Name and Phone Number:    [phone] Crecencio Mc    Current Level of Care: Hospital Recommended Level of Care: Skilled Nursing Facility Prior Approval Number:    Date Approved/Denied:   PASRR Number: pending  Discharge Plan: SNF    Current Diagnoses: Patient Active Problem List   Diagnosis Date Noted  . Dementia with behavioral disturbance 09/16/2017  . Bilateral lower extremity edema 09/16/2017  . Anxiety and depression 09/16/2017  . Hypothyroidism 09/16/2017  . History of CVA (cerebrovascular accident) 09/16/2017  . History of blood clots 09/16/2017  . CHF exacerbation (HCC) 09/16/2017    Orientation RESPIRATION BLADDER Height & Weight     Self, Place  Normal Incontinent Weight: 163 lb 2.3 oz (74 kg) Height:   (162.6 cm)  BEHAVIORAL SYMPTOMS/MOOD NEUROLOGICAL BOWEL NUTRITION STATUS      Continent Diet(2 gram sodium, fluid restriction)  AMBULATORY STATUS COMMUNICATION OF NEEDS Skin   Limited Assist Verbally PU Stage and Appropriate Care                       Personal Care Assistance Level of Assistance  Bathing, Feeding, Dressing Bathing Assistance: Limited assistance Feeding assistance: Independent Dressing Assistance: Limited assistance     Functional Limitations Info  Sight, Hearing, Speech Sight Info: Adequate Hearing Info: Adequate Speech Info: Adequate    SPECIAL CARE FACTORS FREQUENCY  PT (By licensed PT), OT (By licensed OT)     PT Frequency: 5x OT Frequency: 5x            Contractures Contractures Info: Not present    Additional Factors Info  Code Status,  Allergies Code Status Info: full code Allergies Info:  Iron, Plaquenil Hydroxychloroquine Sulfate           Current Medications (09/18/2017):  This is the current hospital active medication list Current Facility-Administered Medications  Medication Dose Route Frequency Provider Last Rate Last Dose  . 0.9 %  sodium chloride infusion  250 mL Intravenous PRN Richarda Overlie, MD      . acetaminophen (TYLENOL) tablet 650 mg  650 mg Oral Q4H PRN Richarda Overlie, MD      . enoxaparin (LOVENOX) injection 40 mg  40 mg Subcutaneous Q24H Richarda Overlie, MD   40 mg at 09/17/17 2124  . FLUoxetine (PROZAC) capsule 20 mg  20 mg Oral Daily Richarda Overlie, MD   20 mg at 09/17/17 0911  . [START ON 09/19/2017] furosemide (LASIX) injection 40 mg  40 mg Intravenous Daily Adhikari, Amrit, MD      . levothyroxine (SYNTHROID, LEVOTHROID) tablet 75 mcg  75 mcg Oral QAC breakfast Burnadette Pop, MD   75 mcg at 09/17/17 0911  . LORazepam (ATIVAN) tablet 0.5 mg  0.5 mg Oral Q8H Richarda Overlie, MD   0.5 mg at 09/18/17 0651  . metoprolol tartrate (LOPRESSOR) tablet 25 mg  25 mg Oral BID Richarda Overlie, MD   25 mg at 09/17/17 2122  . ondansetron (ZOFRAN) injection 4 mg  4 mg Intravenous Q6H PRN Richarda Overlie, MD      . Melene Muller ON 09/19/2017] potassium chloride SA (K-DUR,KLOR-CON) CR  tablet 20 mEq  20 mEq Oral Daily Adhikari, Amrit, MD      . QUEtiapine (SEROQUEL) tablet 25 mg  25 mg Oral QHS Richarda Overlie, MD   25 mg at 09/17/17 2122  . sodium chloride flush (NS) 0.9 % injection 3 mL  3 mL Intravenous Q12H Richarda Overlie, MD   3 mL at 09/18/17 0651  . sodium chloride flush (NS) 0.9 % injection 3 mL  3 mL Intravenous PRN Richarda Overlie, MD         Discharge Medications: Please see discharge summary for a list of discharge medications.  Relevant Imaging Results:  Relevant Lab Results:   Additional Information SS# [id number]  Nelwyn Salisbury, LCSW

## 2017-09-18 NOTE — Progress Notes (Signed)
PROGRESS NOTE    Cassandra Patton  YNW:[id number] DOB: 08/20/1925 DOA: 09/16/2017 PCP: Deeann Saint, MD   Brief Narrative: Patient is a 84 year old female with past medical history of depression, dementia, hypertension, hypothyroidism who presents to the emergency department with worsening lower extremity edema.  Patient reported to have chronic lower extremity edema with weeping, inability to walk, abdominal distention orthopnea worsening since last 3 months.  Patient was taking oral Lasix at home without much improvement.  Chest x-ray did not show any  acute cardiopulmonary abnormalities on presentation. Patient admitted for the management of CHF exacerbation.  Assessment & Plan:   Principal Problem:   Bilateral lower extremity edema Active Problems:   Hypothyroidism   CHF exacerbation (HCC)  CHF exacerbation: No known history of CHF in the past.  Presented with worsening severe bilateral lower extremity edema .Started on IV diuresis.  Echocardiogram done here shows normal left ventricle ejection fraction, no wall motion abnormalities, grade 1 diastolic dysfunction.BNP 226. Marland Kitchen  Chest x-ray on presentation did not show any active disease.  EKG without any ischemic changes  Bilateral lower extremity edema: Significantly improved this morning .we will elevate her legs, continue diuresis, compression stockings for severe peripheral edema the lower extremities. Wound care consulted for weeping lower extremity skin superficial ulcers.  AKI: Most likely due to significant diuresis resulting in  volume depletion.  Will stop IV Lasix and continue oral Lasix from tomorrow.  Will check BMP tomorrow.  Hypertension: Amlodipine will be stopped  permanently for lower extremity edema.  Continue metoprolol   Hypothyroidism: Continue Synthyroid at 75 mcg.  Dementia/depression: Continue Prozac, lorazepam, Seroquel  Deconditioning/debility: Requested physical therapy for evaluation.Recommended SNF. SW  following.   DVT prophylaxis: Lovenox Code Status: Full Family Communication: None present at the bedside Disposition Plan: SNF as soon as the bed is available  Consultants: None   Procedures: Echocardiogram  Antimicrobials: None  Subjective: Patient seen and examined the bedside this morning.  Remains comfortable.  Significant improvement in the bilateral lower extremity edema today.  Objective: Vitals:   09/16/17 2143 09/17/17 0547 09/17/17 2050 09/18/17 0450  BP: 132/70 (!) 144/56 (!) 128/93 (!) 121/51  Pulse: 74 73 74 67  Resp: Temp: 98.6 F (37 C) 97.8 F (36.6 C) 98.8 F (37.1 C) 98.1 F (36.7 C)  TempSrc:    Oral  SpO2: 96% 95% 98% 93%  Weight:  77.8 kg (171 lb 8.3 oz)  74 kg (163 lb 2.3 oz)  Height:        Intake/Output Summary (Last 24 hours) at 09/18/2017 1319 Last data filed at 09/18/2017 1229 Gross per 24 hour  Intake 520 ml  Output 2850 ml  Net -2330 ml   Filed Weights   09/16/17 1218 09/17/17 0547 09/18/17 0450  Weight: 79.8 kg (176 lb) 77.8 kg (171 lb 8.3 oz) 74 kg (163 lb 2.3 oz)    Examination:  General exam: Appears calm and comfortable ,Not in distress,average built HEENT:PERRL,Oral mucosa moist, Ear/Nose normal on gross exam Respiratory system: Bilateral equal air entry, normal vesicular breath sounds, no wheezes or crackles  Cardiovascular system: S1 & S2 heard, RRR. No JVD, murmurs, rubs, gallops or clicks.  Gastrointestinal system: Abdomen is nondistended, soft and nontender. No organomegaly or masses felt. Normal bowel sounds heard. Central nervous system: Alert and oriented. No focal neurological deficits. Extremities:No edema, no clubbing ,no cyanosis, distal peripheral pulses palpable. Skin: No rashes, lesions or ulcers,no icterus ,no pallor MSK: Normal muscle  bulk,tone ,power Psychiatry: Judgement and insight appear normal. Mood & affect appropriate.     Data Reviewed: I have personally reviewed following labs and  imaging studies  CBC: Recent Labs  Lab 09/16/17 1528 09/16/17 1839  WBC 7.0 7.9  NEUTROABS 4.7  --   HGB 11.7* 12.7  HCT 35.2* 37.2  MCV 92.6 93.5  PLT 372 383   Basic Metabolic Panel: Recent Labs  Lab 09/16/17 1528 09/16/17 1839 09/17/17 0503 09/18/17 0519  NA 137  --  139 137  K 2.9*  --  3.9 4.2  CL 99*  --  99* 96*  CO2 27  --  28 31  GLUCOSE 131*  --  106* 111*  BUN 15  --  13 17  CREATININE 0.89 0.88 0.92 1.08*  CALCIUM 8.7*  --  8.5* 8.8*  MG  --  1.8  --   --    GFR: Estimated Creatinine Clearance: 34.8 mL/min (A) (by C-G formula based on SCr of 1.08 mg/dL (H)). Liver Function Tests: No results for input(s): AST, ALT, ALKPHOS, BILITOT, PROT, ALBUMIN in the last 168 hours. No results for input(s): LIPASE, AMYLASE in the last 168 hours. No results for input(s): AMMONIA in the last 168 hours. Coagulation Profile: No results for input(s): INR, PROTIME in the last 168 hours. Cardiac Enzymes: Recent Labs  Lab 09/16/17 1528 09/16/17 1839 09/16/17 2249 09/17/17 0503  TROPONINI <0.03 <0.03 <0.03 <0.03   BNP (last 3 results) Recent Labs    09/03/17 1225  PROBNP 112.0*   HbA1C: No results for input(s): HGBA1C in the last 72 hours. CBG: No results for input(s): GLUCAP in the last 168 hours. Lipid Profile: No results for input(s): CHOL, HDL, LDLCALC, TRIG, CHOLHDL, LDLDIRECT in the last 72 hours. Thyroid Function Tests: Recent Labs    09/16/17 1536  TSH 6.803*   Anemia Panel: No results for input(s): VITAMINB12, FOLATE, FERRITIN, TIBC, IRON, RETICCTPCT in the last 72 hours. Sepsis Labs: No results for input(s): PROCALCITON, LATICACIDVEN in the last 168 hours.  Recent Results (from the past 240 hour(s))  Urine culture     Status: None   Collection Time: 09/16/17  3:02 PM  Result Value Ref Range Status   Specimen Description   Final    URINE, RANDOM Performed at [facility], [address], [place], [place] [address]     Special Requests   Final    NONE Performed at [facility], [address], [place], [place] [address]    Culture   Final    NO GROWTH Performed at [facility], [address] [place], [place] [address]    Report Status 09/17/2017 FINAL  Final  MRSA PCR Screening     Status: None   Collection Time: 09/16/17  6:55 PM  Result Value Ref Range Status   MRSA by PCR NEGATIVE NEGATIVE Final    Comment:        The GeneXpert MRSA Assay (FDA approved for NASAL specimens only), is one component of a comprehensive MRSA colonization surveillance program. It is not intended to diagnose MRSA infection nor to guide or monitor treatment for MRSA infections. Performed at [facility], [address] [place], [place] [address]          Radiology Studies: Dg Chest 2 View  Result Date: 09/16/2017 CLINICAL DATA:  Lower extremity swelling. EXAM: CHEST - 2 VIEW COMPARISON:  None. FINDINGS: The heart is at the upper limits of normal  in size. Normal mediastinal contours. Atherosclerotic calcification of the aortic arch. Normal pulmonary vascularity. Elevation of the right hemidiaphragm. No focal consolidation, pleural effusion, or pneumothorax. No acute osseous abnormality. IMPRESSION: 1.  No active cardiopulmonary disease. 2.  Aortic atherosclerosis (ICD10-I70.0). Electronically Signed   By: Obie Dredge M.D.   On: 09/16/2017 15:20        Scheduled Meds: . enoxaparin (LOVENOX) injection  40 mg Subcutaneous Q24H  . FLUoxetine  20 mg Oral Daily  . [START ON 09/19/2017] furosemide  20 mg Oral Daily  . levothyroxine  75 mcg Oral QAC breakfast  . LORazepam  0.5 mg Oral Q8H  . metoprolol tartrate  25 mg Oral BID  . [START ON 09/19/2017] potassium chloride  20 mEq Oral Daily  . QUEtiapine  25 mg Oral QHS  . sodium chloride flush  3 mL Intravenous Q12H   Continuous Infusions: . sodium chloride       LOS: 2 days    Time spent: 25 mins.More than  50% of that time was spent in counseling and/or coordination of care.      Burnadette Pop, MD [facility] Pager [phone]  If 7PM-7AM, please contact night-coverage [email or web address] Password [email or web address] 09/18/2017, 1:19 PM

## 2017-09-18 NOTE — Evaluation (Signed)
Occupational Therapy Evaluation Patient Details Name: Cassandra Patton MRN: [id number] DOB: 08/02/1925 Today's Date: 09/18/2017    History of Present Illness 84 yo female admitted with CHF exac, bil LE edema. Hx of dementia, CVA, OA, LE edema   Clinical Impression   Pt admitted with the above. Pt currently with functional limitations due to the deficits listed below (see OT Problem List).  Pt will benefit from skilled OT to increase their safety and independence with ADL and functional mobility for ADL to facilitate discharge to venue listed below.      Follow Up Recommendations    SNF or HH with 24/7 A         Precautions / Restrictions Precautions Precautions: Fall Precaution Comments: incontinent Restrictions Weight Bearing Restrictions: No      Mobility Bed Mobility Overal bed mobility: Needs Assistance Bed Mobility: Supine to Sit     Supine to sit: HOB elevated;Min assist        Transfers Overall transfer level: Needs assistance Equipment used: Rolling walker (2 wheeled) Transfers: Sit to/from [employer] Sit to Stand: Min assist Stand pivot transfers: Min guard            Balance Overall balance assessment: Needs assistance         Standing balance support: Bilateral upper extremity supported Standing balance-Leahy Scale: Poor                             ADL either performed or assessed with clinical judgement   ADL Overall ADL's : Needs assistance/impaired Eating/Feeding: Sitting;Minimal assistance   Grooming: Minimal assistance;Sitting   Upper Body Bathing: Minimal assistance;Sitting   Lower Body Bathing: Maximal assistance;Sit to/from stand;Cueing for sequencing;Cueing for safety   Upper Body Dressing : Minimal assistance;Sitting   Lower Body Dressing: Maximal assistance;Sit to/from stand;Cueing for sequencing;Cueing for safety   Toilet Transfer: Moderate assistance;Stand-pivot;BSC;RW   Toileting- Clothing  Manipulation and Hygiene: Maximal assistance;Sit to/from stand;Cueing for sequencing;Cueing for safety         General ADL Comments: pt agreed to get to chair.  Pt having hard time getting words out at times and stated 'why cant i talk'  Spoke to pts CNA who came in and stated speech was unchanged     Vision Patient Visual Report: No change from baseline       Perception     Praxis      Pertinent Vitals/Pain       Hand Dominance     Extremity/Trunk Assessment         Cervical / Trunk Assessment Cervical / Trunk Assessment: Kyphotic   Communication Communication Communication: HOH   Cognition Arousal/Alertness: Awake/alert Behavior During Therapy: WFL for tasks assessed/performed Overall Cognitive Status: History of cognitive impairments - at baseline                                 General Comments: hx of dementia per chart   General Comments               Home Living   Living Arrangements: Alone   Type of Home: Independent living facility([facility])                       Home Equipment: Dan Humphreys - 2 wheels          Prior Functioning/Environment  Comments: uses walker for ambulation        OT Problem List: Impaired balance (sitting and/or standing);Decreased safety awareness;Decreased activity tolerance;Decreased knowledge of use of DME or AE      OT Treatment/Interventions: Self-care/ADL training;Patient/family education    OT Goals(Current goals can be found in the care plan section) Acute Rehab OT Goals Patient Stated Goal: go home OT Goal Formulation: With patient Time For Goal Achievement: 10/02/17 ADL Goals Pt Will Perform Grooming: standing;with supervision Pt Will Perform Lower Body Dressing: with min assist;sit to/from stand Pt Will Transfer to Toilet: with supervision;regular height toilet;ambulating Pt Will Perform Toileting - Clothing Manipulation and hygiene: with supervision;sit to/from  stand  OT Frequency: Min 2X/week    AM-PAC PT "6 Clicks" Daily Activity     Outcome Measure Help from another person eating meals?: A Little Help from another person taking care of personal grooming?: A Little Help from another person toileting, which includes using toliet, bedpan, or urinal?: A Lot Help from another person bathing (including washing, rinsing, drying)?: A Lot Help from another person to put on and taking off regular upper body clothing?: A Little Help from another person to put on and taking off regular lower body clothing?: A Lot 6 Click Score: 15   End of Session Equipment Utilized During Treatment: Rolling walker;Gait belt Nurse Communication: Mobility status  Activity Tolerance: Patient tolerated treatment well Patient left: in chair;with call bell/phone within reach;with chair alarm set  OT Visit Diagnosis: Unsteadiness on feet (R26.81);History of falling (Z91.81);Other abnormalities of gait and mobility (R26.89)                Time: 1255-1320 OT Time Calculation (min): 25 min Charges:  OT General Charges $OT Visit: 1 Visit OT Evaluation $OT Eval Moderate Complexity: 1 Mod OT Treatments $Self Care/Home Management : 8-22 mins G-Codes:     Lise Auer, OT [phone]  Einar Crow D 09/18/2017, 1:31 PM

## 2017-09-19 ENCOUNTER — Encounter (HOSPITAL_COMMUNITY): Payer: Self-pay

## 2017-09-19 LAB — BASIC METABOLIC PANEL WITH GFR
Anion gap: 13 (ref 5–15)
BUN: 16 mg/dL (ref 6–20)
CO2: 30 mmol/L (ref 22–32)
Calcium: 9.1 mg/dL (ref 8.9–10.3)
Chloride: 95 mmol/L — ABNORMAL LOW (ref 101–111)
Creatinine, Ser: 0.9 mg/dL (ref 0.44–1.00)
GFR calc Af Amer: >60 mL/min
GFR calc non Af Amer: 55 mL/min — ABNORMAL LOW
Glucose, Bld: 108 mg/dL — ABNORMAL HIGH (ref 65–99)
Potassium: 3.7 mmol/L (ref 3.5–5.1)
Sodium: 138 mmol/L (ref 135–145)

## 2017-09-19 MED ORDER — FUROSEMIDE 40 MG PO TABS
40.0000 mg | ORAL_TABLET | Freq: Every day | ORAL | Status: DC
  Administered 2017-09-20 – 2017-09-22 (×3): 40 mg via ORAL
  Filled 2017-09-19 (×3): qty 1

## 2017-09-19 NOTE — Progress Notes (Signed)
PROGRESS NOTE    Cassandra Patton  RUE:[id number] DOB: 1925-12-21 DOA: 09/16/2017 PCP: Deeann Saint, MD   Brief Narrative: Patient is a 84 year old female with past medical history of depression, dementia, hypertension, hypothyroidism who presents to the emergency department with worsening lower extremity edema.  Patient reported to have chronic lower extremity edema with weeping, inability to walk, abdominal distention orthopnea worsening since last 3 months.  Patient was taking oral Lasix at home without much improvement.  Chest x-ray did not show any  acute cardiopulmonary abnormalities on presentation. Patient admitted for the management of CHF exacerbation.  Assessment & Plan:   Principal Problem:   Bilateral lower extremity edema Active Problems:   Hypothyroidism   CHF exacerbation (HCC)  CHF exacerbation: No known history of CHF in the past.  Presented with worsening severe bilateral lower extremity edema .Started on IV diuresis which has been changes to oral. Echocardiogram done here shows normal left ventricle ejection fraction, no wall motion abnormalities, grade 1 diastolic dysfunction.BNP 226. Marland KitchenChest x-ray on presentation did not show any active disease.  EKG without any ischemic changes.  Bilateral lower extremity edema: Significantly improved  .We will elevate her legs, continue diuresis, compression stockings for severe peripheral edema the lower extremities. Wound care consulted for weeping lower extremity skin superficial ulcers. Continue oral lasix.  AKI: Improved.  We will continue to monitor  Hypertension: Amlodipine will be stopped  permanently for lower extremity edema.  Continue metoprolol   Hypothyroidism: Continue Synthyroid at 75 mcg.  Dementia/depression: Continue Prozac, lorazepam, Seroquel  Deconditioning/debility: Requested physical therapy for evaluation.Recommended SNF. SW following.   DVT prophylaxis: Lovenox Code Status: Full Family  Communication: None present at the bedside Disposition Plan: SNF as soon as the bed is available  Consultants: None   Procedures: Echocardiogram  Antimicrobials: None  Subjective: Patient seen and examined the bedside this morning.  Remains comfortable.  Significant improvement in the bilateral lower extremity edema today.  Desparetely wants to go home.  Objective: Vitals:   09/18/17 0450 09/18/17 1352 09/18/17 1954 09/19/17 0624  BP: (!) 121/51 124/63 107/88 117/63  Pulse: 67 63 68 64  Resp: Temp: 98.1 F (36.7 C) 98.6 F (37 C) 98.4 F (36.9 C) 98.3 F (36.8 C)  TempSrc: Oral Oral Oral Oral  SpO2: 93% 95% 95% 100%  Weight: 74 kg (163 lb 2.3 oz)   72.2 kg (159 lb 2.8 oz)  Height:        Intake/Output Summary (Last 24 hours) at 09/19/2017 1245 Last data filed at 09/19/2017 1000 Gross per 24 hour  Intake 360 ml  Output 1450 ml  Net -1090 ml   Filed Weights   09/17/17 0547 09/18/17 0450 09/19/17 0624  Weight: 77.8 kg (171 lb 8.3 oz) 74 kg (163 lb 2.3 oz) 72.2 kg (159 lb 2.8 oz)    Examination:  General exam: Appears calm and comfortable ,Not in distress,average built HEENT:PERRL,Oral mucosa moist, Ear/Nose normal on gross exam Respiratory system: Bilateral equal air entry, normal vesicular breath sounds, no wheezes or crackles  Cardiovascular system: S1 & S2 heard, RRR. No JVD, murmurs, rubs, gallops or clicks.  Gastrointestinal system: Abdomen is nondistended, soft and nontender. No organomegaly or masses felt. Normal bowel sounds heard. Central nervous system: Alert and oriented. No focal neurological deficits. Extremities:No edema, no clubbing ,no cyanosis, distal peripheral pulses palpable. Skin: No rashes, lesions or ulcers,no icterus ,no pallor MSK: Normal muscle bulk,tone ,power Psychiatry: Judgement and insight appear normal. Mood &  affect appropriate.     Data Reviewed: I have personally reviewed following labs and imaging  studies  CBC: Recent Labs  Lab 09/16/17 1528 09/16/17 1839  WBC 7.0 7.9  NEUTROABS 4.7  --   HGB 11.7* 12.7  HCT 35.2* 37.2  MCV 92.6 93.5  PLT 372 383   Basic Metabolic Panel: Recent Labs  Lab 09/16/17 1528 09/16/17 1839 09/17/17 0503 09/18/17 0519 09/19/17 0528  NA 137  --  139 137 138  K 2.9*  --  3.9 4.2 3.7  CL 99*  --  99* 96* 95*  CO2 27  --  GLUCOSE 131*  --  106* 111* 108*  BUN 15  --  CREATININE 0.89 0.88 0.92 1.08* 0.90  CALCIUM 8.7*  --  8.5* 8.8* 9.1  MG  --  1.8  --   --   --    GFR: Estimated Creatinine Clearance: 41.3 mL/min (by C-G formula based on SCr of 0.9 mg/dL). Liver Function Tests: No results for input(s): AST, ALT, ALKPHOS, BILITOT, PROT, ALBUMIN in the last 168 hours. No results for input(s): LIPASE, AMYLASE in the last 168 hours. No results for input(s): AMMONIA in the last 168 hours. Coagulation Profile: No results for input(s): INR, PROTIME in the last 168 hours. Cardiac Enzymes: Recent Labs  Lab 09/16/17 1528 09/16/17 1839 09/16/17 2249 09/17/17 0503  TROPONINI <0.03 <0.03 <0.03 <0.03   BNP (last 3 results) Recent Labs    09/03/17 1225  PROBNP 112.0*   HbA1C: No results for input(s): HGBA1C in the last 72 hours. CBG: No results for input(s): GLUCAP in the last 168 hours. Lipid Profile: No results for input(s): CHOL, HDL, LDLCALC, TRIG, CHOLHDL, LDLDIRECT in the last 72 hours. Thyroid Function Tests: Recent Labs    09/16/17 1536  TSH 6.803*   Anemia Panel: No results for input(s): VITAMINB12, FOLATE, FERRITIN, TIBC, IRON, RETICCTPCT in the last 72 hours. Sepsis Labs: No results for input(s): PROCALCITON, LATICACIDVEN in the last 168 hours.  Recent Results (from the past 240 hour(s))  Urine culture     Status: None   Collection Time: 09/16/17  3:02 PM  Result Value Ref Range Status   Specimen Description   Final    URINE, RANDOM Performed at [facility], [address] [place], [place] [address]    Special Requests   Final    NONE Performed at [facility], [address], [place], [place] [address]    Culture   Final    NO GROWTH Performed at [facility], [address], [place], [place] [address]    Report Status 09/17/2017 FINAL  Final  MRSA PCR Screening     Status: None   Collection Time: 09/16/17  6:55 PM  Result Value Ref Range Status   MRSA by PCR NEGATIVE NEGATIVE Final    Comment:        The GeneXpert MRSA Assay (FDA approved for NASAL specimens only), is one component of a comprehensive MRSA colonization surveillance program. It is not intended to diagnose MRSA infection nor to guide or monitor treatment for MRSA infections. Performed at [facility], [address] [place], [place] [address]          Radiology Studies: No results found.      Scheduled Meds: . enoxaparin (LOVENOX) injection  40 mg Subcutaneous Q24H  . FLUoxetine  20 mg Oral Daily  . furosemide  20 mg Oral Daily  . levothyroxine  75 mcg Oral QAC breakfast  . LORazepam  0.5 mg Oral Q8H  . metoprolol tartrate  25 mg Oral BID  . potassium chloride  20 mEq Oral Daily  . QUEtiapine  25 mg Oral QHS  . sodium chloride flush  3 mL Intravenous Q12H   Continuous Infusions: . sodium chloride       LOS: 3 days    Time spent: 25 mins.More than 50% of that time was spent in counseling and/or coordination of care.      Burnadette Pop, MD [facility] Pager [phone]  If 7PM-7AM, please contact night-coverage [email or web address] Password [email or web address] 09/19/2017, 12:45 PM

## 2017-09-20 LAB — BASIC METABOLIC PANEL
Anion gap: 13 (ref 5–15)
BUN: 17 mg/dL (ref 6–20)
CO2: 28 mmol/L (ref 22–32)
Calcium: 9.2 mg/dL (ref 8.9–10.3)
Chloride: 96 mmol/L — ABNORMAL LOW (ref 101–111)
Creatinine, Ser: 0.94 mg/dL (ref 0.44–1.00)
GFR calc Af Amer: >60 mL/min (ref >60)
GFR calc non Af Amer: 52 mL/min — ABNORMAL LOW (ref >60)
Glucose, Bld: 109 mg/dL — ABNORMAL HIGH (ref 65–99)
Potassium: 4 mmol/L (ref 3.5–5.1)
SODIUM: 137 mmol/L (ref 135–145)

## 2017-09-20 MED ORDER — LORAZEPAM 2 MG/ML IJ SOLN
1.0000 mg | Freq: Once | INTRAMUSCULAR | Status: AC
  Administered 2017-09-20: 1 mg via INTRAVENOUS
  Filled 2017-09-20: qty 1

## 2017-09-20 NOTE — Progress Notes (Signed)
PROGRESS NOTE    Cassandra Patton  ZOX:[id number] DOB: 06-Oct-1925 DOA: 09/16/2017 PCP: Deeann Saint, MD   Brief Narrative: Patient is a 84 year old female with past medical history of depression, dementia, hypertension, hypothyroidism who presents to the emergency department with worsening lower extremity edema.  Patient reported to have chronic lower extremity edema with weeping, inability to walk, abdominal distention orthopnea worsening since last 3 months.  Patient was taking oral Lasix at home without much improvement.  Chest x-ray did not show any  acute cardiopulmonary abnormalities on presentation. Patient admitted for the management of CHF exacerbation.  Assessment & Plan:   Principal Problem:   Bilateral lower extremity edema Active Problems:   Hypothyroidism   CHF exacerbation (HCC)  CHF exacerbation: No known history of CHF in the past.  Presented with worsening severe bilateral lower extremity edema .Started on IV diuresis which has been changes to oral. Echocardiogram done here shows normal left ventricle ejection fraction, no wall motion abnormalities, grade 1 diastolic dysfunction.BNP 226. Marland KitchenChest x-ray on presentation did not show any active disease.  EKG without any ischemic changes.  Bilateral lower extremity edema: Significantly improved  .We will elevate her legs, continue diuresis, compression stockings for severe peripheral edema the lower extremities. Wound care consulted for weeping lower extremity skin superficial ulcers. Continue oral lasix.  AKI: Improved.  We will continue to monitor  Hypertension: Amlodipine will be stopped  permanently for lower extremity edema.  Continue metoprolol   Hypothyroidism: Continue Synthyroid at 75 mcg.  Dementia/depression: Continue Prozac, lorazepam, Seroquel  Deconditioning/debility: Requested physical therapy for evaluation.Recommended SNF. SW following.   DVT prophylaxis: Lovenox Code Status: Full Family  Communication: None present at the bedside Disposition Plan: SNF as soon as the bed is available.Most likely tomorrow  Consultants: None   Procedures: Echocardiogram  Antimicrobials: None  Subjective: Patient seen and examined the bedside this morning.  No significant changes/issues since yesterday.  Lower extremity edema has resolved.  Patient waiting for skilled nursing facility bed.   Objective: Vitals:   09/19/17 0624 09/19/17 1342 09/19/17 2031 09/20/17 0534  BP: 117/63 129/71 132/71   Pulse: 64 (!) 59 66   Resp: Temp: 98.3 F (36.8 C) 98.4 F (36.9 C) 98.2 F (36.8 C)   TempSrc: Oral Oral Oral   SpO2: 100% 94% 95%   Weight: 72.2 kg (159 lb 2.8 oz)   72.4 kg (159 lb 9.8 oz)  Height:        Intake/Output Summary (Last 24 hours) at 09/20/2017 1114 Last data filed at 09/19/2017 1400 Gross per 24 hour  Intake 240 ml  Output -  Net 240 ml   Filed Weights   09/18/17 0450 09/19/17 0624 09/20/17 0534  Weight: 74 kg (163 lb 2.3 oz) 72.2 kg (159 lb 2.8 oz) 72.4 kg (159 lb 9.8 oz)    Examination:  General exam: Appears calm and comfortable ,Not in distress,average built HEENT:PERRL,Oral mucosa moist, Ear/Nose normal on gross exam Respiratory system: Bilateral equal air entry, normal vesicular breath sounds, no wheezes or crackles  Cardiovascular system: S1 & S2 heard, RRR. No JVD, murmurs, rubs, gallops or clicks. Gastrointestinal system: Abdomen is nondistended, soft and nontender. No organomegaly or masses felt. Normal bowel sounds heard. Central nervous system: Alert and oriented. No focal neurological deficits. Extremities: No edema, no clubbing ,no cyanosis, distal peripheral pulses palpable. Skin: No rashes, lesions or ulcers,no icterus ,no pallor   Data Reviewed: I have personally reviewed following labs and imaging  studies  CBC: Recent Labs  Lab 09/16/17 1528 09/16/17 1839  WBC 7.0 7.9  NEUTROABS 4.7  --   HGB 11.7* 12.7  HCT 35.2* 37.2  MCV  92.6 93.5  PLT 372 383   Basic Metabolic Panel: Recent Labs  Lab 09/16/17 1528 09/16/17 1839 09/17/17 0503 09/18/17 0519 09/19/17 0528 09/20/17 0518  NA 137  --  139 137 138 137  K 2.9*  --  3.9 4.2 3.7 4.0  CL 99*  --  99* 96* 95* 96*  CO2 27  --  GLUCOSE 131*  --  106* 111* 108* 109*  BUN 15  --  CREATININE 0.89 0.88 0.92 1.08* 0.90 0.94  CALCIUM 8.7*  --  8.5* 8.8* 9.1 9.2  MG  --  1.8  --   --   --   --    GFR: Estimated Creatinine Clearance: 39.6 mL/min (by C-G formula based on SCr of 0.94 mg/dL). Liver Function Tests: No results for input(s): AST, ALT, ALKPHOS, BILITOT, PROT, ALBUMIN in the last 168 hours. No results for input(s): LIPASE, AMYLASE in the last 168 hours. No results for input(s): AMMONIA in the last 168 hours. Coagulation Profile: No results for input(s): INR, PROTIME in the last 168 hours. Cardiac Enzymes: Recent Labs  Lab 09/16/17 1528 09/16/17 1839 09/16/17 2249 09/17/17 0503  TROPONINI <0.03 <0.03 <0.03 <0.03   BNP (last 3 results) Recent Labs    09/03/17 1225  PROBNP 112.0*   HbA1C: No results for input(s): HGBA1C in the last 72 hours. CBG: No results for input(s): GLUCAP in the last 168 hours. Lipid Profile: No results for input(s): CHOL, HDL, LDLCALC, TRIG, CHOLHDL, LDLDIRECT in the last 72 hours. Thyroid Function Tests: No results for input(s): TSH, T4TOTAL, FREET4, T3FREE, THYROIDAB in the last 72 hours. Anemia Panel: No results for input(s): VITAMINB12, FOLATE, FERRITIN, TIBC, IRON, RETICCTPCT in the last 72 hours. Sepsis Labs: No results for input(s): PROCALCITON, LATICACIDVEN in the last 168 hours.  Recent Results (from the past 240 hour(s))  Urine culture     Status: None   Collection Time: 09/16/17  3:02 PM  Result Value Ref Range Status   Specimen Description   Final    URINE, RANDOM Performed at [facility], [address], [place], [place] [address]    Special Requests    Final    NONE Performed at [facility], [address] [place], [place] [address]    Culture   Final    NO GROWTH Performed at [facility], [address], [place], [place] [address]    Report Status 09/17/2017 FINAL  Final  MRSA PCR Screening     Status: None   Collection Time: 09/16/17  6:55 PM  Result Value Ref Range Status   MRSA by PCR NEGATIVE NEGATIVE Final    Comment:        The GeneXpert MRSA Assay (FDA approved for NASAL specimens only), is one component of a comprehensive MRSA colonization surveillance program. It is not intended to diagnose MRSA infection nor to guide or monitor treatment for MRSA infections. Performed at [facility], [address], [place], [place] [address]          Radiology Studies: No results found.      Scheduled Meds: . enoxaparin (LOVENOX) injection  40 mg Subcutaneous Q24H  . FLUoxetine  20 mg Oral Daily  . furosemide  40  mg Oral Daily  . levothyroxine  75 mcg Oral QAC breakfast  . LORazepam  0.5 mg Oral Q8H  . metoprolol tartrate  25 mg Oral BID  . potassium chloride  20 mEq Oral Daily  . QUEtiapine  25 mg Oral QHS  . sodium chloride flush  3 mL Intravenous Q12H   Continuous Infusions: . sodium chloride       LOS: 4 days    Time spent: 25 mins.More than 50% of that time was spent in counseling and/or coordination of care.      Burnadette Pop, MD [facility] Pager [phone]  If 7PM-7AM, please contact night-coverage [email or web address] Password [email or web address] 09/20/2017, 11:14 AM

## 2017-09-20 NOTE — Progress Notes (Signed)
Physical Therapy Treatment Patient Details Name: Cassandra Patton MRN: [id number] DOB: 03-Jan-1926 Today's Date: 09/20/2017    History of Present Illness 84 yo female admitted with CHF exac, bil LE edema. Hx of dementia, CVA, OA, LE edema    PT Comments    Pt was upset about not going home on today. She was able to be redirected for participation with PT. Again, pt required total assist for hygiene-bladder incontinence. Pt took a few steps in room with a RW. She appeared less steady today compared to last session. Assisted pt into recliner. Continue to recommend SNF.     Follow Up Recommendations  SNF(prior to returning to [facility] Living)     Equipment Recommendations  None recommended by PT    Recommendations for Other Services       Precautions / Restrictions Precautions Precautions: Fall Precaution Comments: incontinent Restrictions Weight Bearing Restrictions: No    Mobility  Bed Mobility Overal bed mobility: Needs Assistance Bed Mobility: Supine to Sit     Supine to sit: Min guard;HOB elevated     General bed mobility comments: close guard for safety. Increased time.   Transfers Overall transfer level: Needs assistance Equipment used: Rolling walker (2 wheeled) Transfers: Sit to/from Stand Sit to Stand: Min assist Stand pivot transfers: Min assist       General transfer comment: Assist to rise, stabilize, control desent. VCs hand placement. Stand pivot, bed to bsc, with RW.   Ambulation/Gait Ambulation/Gait assistance: Min assist Ambulation Distance (Feet): 6 Feet Assistive device: Rolling walker (2 wheeled)       General Gait Details: Assist to maneuver with walker in close quarters. Cues for safety and to maintain grasp on walker handles.    Stairs             Wheelchair Mobility    Modified Rankin (Stroke Patients Only)       Balance Overall balance assessment: Needs assistance         Standing balance support: Bilateral upper  extremity supported Standing balance-Leahy Scale: Poor                              Cognition Arousal/Alertness: Awake/alert Behavior During Therapy: WFL for tasks assessed/performed Overall Cognitive Status: History of cognitive impairments - at baseline                                 General Comments: hx of dementia per chart      Exercises      General Comments        Pertinent Vitals/Pain Pain Assessment: No/denies pain    Home Living                      Prior Function            PT Goals (current goals can now be found in the care plan section) Progress towards PT goals: Progressing toward goals    Frequency    Min 3X/week      PT Plan Current plan remains appropriate    Co-evaluation              AM-PAC PT "6 Clicks" Daily Activity  Outcome Measure  Difficulty turning over in bed (including adjusting bedclothes, sheets and blankets)?: A Little Difficulty moving from lying on back to sitting on the side of the bed? : A  Little Difficulty sitting down on and standing up from a chair with arms (e.g., wheelchair, bedside commode, etc,.)?: Unable Help needed moving to and from a bed to chair (including a wheelchair)?: A Little Help needed walking in hospital room?: A Little Help needed climbing 3-5 steps with a railing? : A Lot 6 Click Score: 15    End of Session Equipment Utilized During Treatment: Gait belt Activity Tolerance: Patient tolerated treatment well Patient left: in chair;with call bell/phone within reach;with chair alarm set   PT Visit Diagnosis: Difficulty in walking, not elsewhere classified (R26.2);Muscle weakness (generalized) (M62.81)     Time: 8119-1478 PT Time Calculation (min) (ACUTE ONLY): 13 min  Charges:  $Therapeutic Activity: 8-22 mins                    G Codes:          Rebeca Alert, MPT Pager: [phone]

## 2017-09-20 NOTE — Progress Notes (Signed)
CSW spoke to patient family about SNF options, [facility] staff are not available today in observance of the [facility].  CSW will attempt to call in the am. [place] [facility] closed today, unable to obtain.   CSW will continue to assist with discharge planning.   Vivi Barrack, Theresia Majors, MSW Clinical Social Worker  [phone] 09/20/2017  9:27 AM

## 2017-09-21 MED ORDER — LEVOTHYROXINE SODIUM 75 MCG PO TABS: 75.0000 ug | ORAL_TABLET | Freq: Every day | ORAL | 0 refills | Status: AC

## 2017-09-21 MED ORDER — FUROSEMIDE 40 MG PO TABS: 40.0000 mg | ORAL_TABLET | Freq: Every day | ORAL | 0 refills | Status: AC

## 2017-09-21 MED ORDER — LORAZEPAM 0.5 MG PO TABS
0.5000 mg | ORAL_TABLET | Freq: Two times a day (BID) | ORAL | Status: DC
  Administered 2017-09-21 – 2017-09-22 (×2): 0.5 mg via ORAL
  Filled 2017-09-21 (×2): qty 1

## 2017-09-21 MED ORDER — QUETIAPINE FUMARATE 25 MG PO TABS
50.0000 mg | ORAL_TABLET | Freq: Every day | ORAL | Status: DC
  Administered 2017-09-21: 50 mg via ORAL
  Filled 2017-09-21: qty 2

## 2017-09-21 MED ORDER — POTASSIUM CHLORIDE CRYS ER 20 MEQ PO TBCR: 20.0000 meq | EXTENDED_RELEASE_TABLET | Freq: Every day | ORAL | 7 refills | Status: AC

## 2017-09-21 NOTE — Discharge Summary (Addendum)
Physician Discharge Summary  Cassandra Patton:[id number] DOB: 02-Nov-1925 DOA: 09/16/2017  PCP: Deeann Saint, MD  Admit date: 09/16/2017 Discharge date: 09/22/2017  Admitted From: Home Disposition:  SNF  Discharge Condition:Stable CODE STATUS:FULL Diet recommendation: Heart Healthy  Brief/Interim Summary:  Patient is a 84 year old female with past medical history of depression, dementia, hypertension, hypothyroidism who presents to the emergency department with worsening lower extremity edema.  Patient reported to have chronic lower extremity edema with weeping, inability to walk, abdominal distention, orthopnea which was worsening since last 3 months. Patient was taking oral Lasix at home without much improvement.  Chest x-ray did not show any  acute cardiopulmonary abnormalities on presentation.Patient admitted for the management of CHF exacerbation. Patient was started on IV diuresis with excellent response.  Her lower extremity edema significantly improved after diuresis.  IV Lasix has been changed to oral now.  She was evaluated by physical therapy and recommended skilled nursing facility on discharge. She has remained hemodynamically stable.  No new issues/events.  She is stable for discharge to skilled nursing facility as soon as the bed is available.  Following problems were addressed during her hospitalization:  Heart failure with preserved ejection fraction: No known history of CHF in the past.  Presented with worsening severe bilateral lower extremity edema .Started on IV diuresis which has been changes to oral. Echocardiogram done here shows normal left ventricle ejection fraction, no wall motion abnormalities, grade 1 diastolic dysfunction.BNP 226. Marland KitchenChest x-ray on presentation did not show any active disease.  EKG without any ischemic changes.  Currently she is euvolemic.  Bilateral lower extremity edema: Significantly improved  .We will recommend to elevate her legs,  continue diuresis, compression stockings for  edema the lower extremities. Wound care consulted for weeping lower extremity skin superficial ulcers. Continue oral lasix.  Continue potassium while she is on Lasix.  Check BMP in a week.  AKI: Improved.  We will continue to monitor  Hypertension: Amlodipine will be stopped  permanently for lower extremity edema.  Continue metoprolol   Hypothyroidism: Continue Synthyroid at 75 mcg.She was on 50 mcg at home and her TSH was found to be mildly elevated.  Dementia/depression: Continue home medications Prozac, lorazepam, Seroquel.  Her mood has been stable.  Deconditioning/debility: Requested physical therapy for evaluation.Recommended SNF. SW following.     Discharge Diagnoses:  Principal Problem:   Bilateral lower extremity edema Active Problems:   Hypothyroidism   CHF exacerbation [facility])    Discharge Instructions  Discharge Instructions    Amb Referral to HF Clinic   Complete by:  As directed    Diet - low sodium heart healthy   Complete by:  As directed    Discharge instructions   Complete by:  As directed    1) Follow up with your PCP in a week .  Do a CBC and BMP testing a week during the follow-up. 2) Take prescribed medications as instructed.   Increase activity slowly   Complete by:  As directed      Allergies as of 09/21/2017      Reactions   Iron    Plaquenil [hydroxychloroquine Sulfate] Other (See Comments)   Bleached skin      Medication List    STOP taking these medications   amLODipine 5 MG tablet Commonly known as:  NORVASC     TAKE these medications   EYE VITAMINS PO Take 1 capsule by mouth daily.   FLUoxetine 20 MG tablet Commonly known as:  PROZAC  Take 1 tablet (20 mg total) by mouth daily.   furosemide 40 MG tablet Commonly known as:  LASIX Take 1 tablet (40 mg total) by mouth daily. What changed:    medication strength  how much to take   levothyroxine 75 MCG tablet Commonly  known as:  SYNTHROID, LEVOTHROID Take 1 tablet (75 mcg total) by mouth daily before breakfast. Start taking on:  09/22/2017 What changed:    medication strength  how much to take   LORazepam 0.5 MG tablet Commonly known as:  ATIVAN Take 0.5 mg by mouth every 8 (eight) hours.   Melatonin 10 MG Tabs Take 10 mg by mouth at bedtime.   metoprolol tartrate 25 MG tablet Commonly known as:  LOPRESSOR Take 1 tablet (25 mg total) by mouth 2 (two) times daily.   potassium chloride SA 20 MEQ tablet Commonly known as:  K-DUR,KLOR-CON Take 1 tablet (20 mEq total) by mouth daily. Start taking on:  09/22/2017   QUEtiapine 25 MG tablet Commonly known as:  SEROQUEL Take 25 mg by mouth at bedtime.      Follow-up Information    Deeann Saint, MD. Schedule an appointment as soon as possible for a visit in 1 week(s).   Specialty:  Family Medicine Contact information: [address] [place] [address] [phone]          Allergies  Allergen Reactions  . Iron   . Plaquenil [Hydroxychloroquine Sulfate] Other (See Comments)    Bleached skin    Consultations: None  Procedures/Studies: Dg Chest 2 View  Result Date: 09/16/2017 CLINICAL DATA:  Lower extremity swelling. EXAM: CHEST - 2 VIEW COMPARISON:  None. FINDINGS: The heart is at the upper limits of normal in size. Normal mediastinal contours. Atherosclerotic calcification of the aortic arch. Normal pulmonary vascularity. Elevation of the right hemidiaphragm. No focal consolidation, pleural effusion, or pneumothorax. No acute osseous abnormality. IMPRESSION: 1.  No active cardiopulmonary disease. 2.  Aortic atherosclerosis (ICD10-I70.0). Electronically Signed   By: Obie Dredge M.D.   On: 09/16/2017 15:20       Subjective: Patient seen and examined at the bedside this morning.  Remains comfortable.  No new issues/events.  Stable for discharge to skilled nursing facility as soon as the bed is  available.  Discharge Exam: Vitals:   09/21/17 1216 09/21/17 1304  BP:  126/67  Pulse:  67  Resp:  18  Temp:  98.7 F (37.1 C)  SpO2: 99% 95%   Vitals:   09/20/17 2017 09/21/17 0633 09/21/17 1216 09/21/17 1304  BP: (!) 147/62 (!) 143/69  126/67  Pulse: 72 63  67  Resp: Temp: 98 F (36.7 C) 98 F (36.7 C)  98.7 F (37.1 C)  TempSrc: Oral Oral  Oral  SpO2:  95% 99% 95%  Weight:  73 kg (160 lb 15 oz)    Height:        General: Pt is alert, awake, not in acute distress, pleasant elderly lady Cardiovascular: RRR, S1/S2 +, no rubs, no gallops Respiratory: CTA bilaterally, no wheezing, no rhonchi Abdominal: Soft, NT, ND, bowel sounds + Extremities: no edema, no cyanosis    The results of significant diagnostics from this hospitalization (including imaging, microbiology, ancillary and laboratory) are listed below for reference.     Microbiology: Recent Results (from the past 240 hour(s))  Urine culture     Status: None   Collection Time: 09/16/17  3:02 PM  Result Value Ref Range Status  Specimen Description   Final    URINE, RANDOM Performed at [facility], [address], [place], [place] [address]    Special Requests   Final    NONE Performed at [facility], [address], [place], [place] [address]    Culture   Final    NO GROWTH Performed at [facility], [address] [place], [place] [address]    Report Status 09/17/2017 FINAL  Final  MRSA PCR Screening     Status: None   Collection Time: 09/16/17  6:55 PM  Result Value Ref Range Status   MRSA by PCR NEGATIVE NEGATIVE Final    Comment:        The GeneXpert MRSA Assay (FDA approved for NASAL specimens only), is one component of a comprehensive MRSA colonization surveillance program. It is not intended to diagnose MRSA infection nor to guide or monitor treatment for MRSA infections. Performed at [facility], [address] [place], [place] [address]      Labs: BNP (last 3 results) Recent Labs    09/16/17 1529 09/16/17 1839  BNP 179.4* 226.7*   Basic Metabolic Panel: Recent Labs  Lab 09/16/17 1528 09/16/17 1839 09/17/17 0503 09/18/17 0519 09/19/17 0528 09/20/17 0518  NA 137  --  139 137 138 137  K 2.9*  --  3.9 4.2 3.7 4.0  CL 99*  --  99* 96* 95* 96*  CO2 27  --  GLUCOSE 131*  --  106* 111* 108* 109*  BUN 15  --  CREATININE 0.89 0.88 0.92 1.08* 0.90 0.94  CALCIUM 8.7*  --  8.5* 8.8* 9.1 9.2  MG  --  1.8  --   --   --   --    Liver Function Tests: No results for input(s): AST, ALT, ALKPHOS, BILITOT, PROT, ALBUMIN in the last 168 hours. No results for input(s): LIPASE, AMYLASE in the last 168 hours. No results for input(s): AMMONIA in the last 168 hours. CBC: Recent Labs  Lab 09/16/17 1528 09/16/17 1839  WBC 7.0 7.9  NEUTROABS 4.7  --   HGB 11.7* 12.7  HCT 35.2* 37.2  MCV 92.6 93.5  PLT 372 383   Cardiac Enzymes: Recent Labs  Lab 09/16/17 1528 09/16/17 1839 09/16/17 2249 09/17/17 0503  TROPONINI <0.03 <0.03 <0.03 <0.03   BNP: Invalid input(s): POCBNP CBG: No results for input(s): GLUCAP in the last 168 hours. D-Dimer No results for input(s): DDIMER in the last 72 hours. Hgb A1c No results for input(s): HGBA1C in the last 72 hours. Lipid Profile No results for input(s): CHOL, HDL, LDLCALC, TRIG, CHOLHDL, LDLDIRECT in the last 72 hours. Thyroid function studies No results for input(s): TSH, T4TOTAL, T3FREE, THYROIDAB in the last 72 hours.  Invalid input(s): FREET3 Anemia work up No results for input(s): VITAMINB12, FOLATE, FERRITIN, TIBC, IRON, RETICCTPCT in the last 72 hours. Urinalysis    Component Value Date/Time   COLORURINE YELLOW 09/16/2017 1502   APPEARANCEUR CLEAR 09/16/2017 1502   LABSPEC 1.012 09/16/2017 1502   PHURINE 6.0 09/16/2017 1502   GLUCOSEU NEGATIVE 09/16/2017 1502   HGBUR NEGATIVE 09/16/2017 1502    BILIRUBINUR NEGATIVE 09/16/2017 1502   BILIRUBINUR n 09/03/2017 1337   KETONESUR NEGATIVE 09/16/2017 1502   PROTEINUR NEGATIVE 09/16/2017 1502   UROBILINOGEN 1.0 09/03/2017 1337   NITRITE NEGATIVE 09/16/2017 1502   LEUKOCYTESUR NEGATIVE 09/16/2017 1502   Sepsis Labs Invalid  input(s): PROCALCITONIN,  WBC,  LACTICIDVEN Microbiology Recent Results (from the past 240 hour(s))  Urine culture     Status: None   Collection Time: 09/16/17  3:02 PM  Result Value Ref Range Status   Specimen Description   Final    URINE, RANDOM Performed at [facility], [address], [place], [place] [address]    Special Requests   Final    NONE Performed at [facility], [address], [place], [place] [address]    Culture   Final    NO GROWTH Performed at [facility], [address], [place], [place] [address]    Report Status 09/17/2017 FINAL  Final  MRSA PCR Screening     Status: None   Collection Time: 09/16/17  6:55 PM  Result Value Ref Range Status   MRSA by PCR NEGATIVE NEGATIVE Final    Comment:        The GeneXpert MRSA Assay (FDA approved for NASAL specimens only), is one component of a comprehensive MRSA colonization surveillance program. It is not intended to diagnose MRSA infection nor to guide or monitor treatment for MRSA infections. Performed at [facility], [address], [place], [place] [address]     Please note: You were cared for by a hospitalist during your hospital stay. Once you are discharged, your primary care physician will handle any further medical issues. Please note that NO REFILLS for any discharge medications will be authorized once you are discharged, as it is imperative that you return to your primary care physician (or establish a relationship with a primary care physician if you do not have one) for your post hospital discharge needs so that they can reassess your need for medications and  monitor your lab values.    Time coordinating discharge: 40 minutes  SIGNED:   Burnadette Pop, MD  [facility] 09/21/2017, 1:33 PM Pager [phone]  If 7PM-7AM, please contact night-coverage [email or web address] Password [email or web address]

## 2017-09-21 NOTE — Progress Notes (Signed)
CSW contacted ([facility] [phone]) to check on status of patient's PASRR. Staff reported that patient is under DMH status and that CSW needs to contact [phone]).  CSW contacted [phone]) . They reported that CSW needs to contact [facility] at [phone]).  CSW contacted [phone] and spoke with staff member Red Christians). CSW inquired about patient's PASRR, staff reported that she sent patient's PASRR back to manual review because she couldn't read MD signature on dementia note. CSW asked for fax number to send a new dementia note to staff. Staff reported that CSW needs to fax note to [facility].  CSW updated patient's MD. Patient's MD added printed name beside signature on dementia note.  CSW faxed dementia note to [facility].  CSW contacted [facility] to confirm receipt of dementia note that was re-faxed. Staff reported that a nurse is currently working on [employer].   CSW contacted [facility] SNF to provide update, no answer. CSW left voicemail requesting return phone call.   CSW contacted patient's niece Crecencio Mc [phone]) and provided update. Patient's niece inquired about patient receiving home medications while in the hospital. CSW agreed to ask patient's RN to follow up with patient's niece about patient's current medication regimen.   Patient's RN currently busy. CSW provided message to Charge RN to notify patient's RN to contact patient's niece to discuss patient's medication regimen.   CSW received return call from [facility] [phone]) staff member Shawna Orleans, CSW provided update.  CSW will continue to follow and assist with discharge planning.   Patient does not have a PASRR. PASRR is the only barrier to discharge.  Celso Sickle, [place] Clinical Social Worker [facility] Cell#: [phone]

## 2017-09-21 NOTE — Progress Notes (Signed)
CSW received return call from [facility] staff member [place]. Staff reported that they are able to offer patient a bed. Staff reported that they will need dc summary before 4 pm to admit patient today. CSW informed staff that patient's PASRR is still pending. CSW agreed to follow up with SNF staff after patient's PASRR is completed.  CSW contacted [facility] [phone]) to get an update on patient's PASRR. Staff reported that all of patient's documents have been received and that patient is in que to be worked up. Staff reported that they are coming back from a holiday and are behind.  CSW contacted patient's niece Crecencio Mc [phone]) to provide update, no answer. CSW left voicemail requesting return phone call.  CSW will continue to follow and assist with discharge planning.  Celso Sickle, [place] Clinical Social Worker [facility] Cell#: [phone]

## 2017-09-21 NOTE — Progress Notes (Signed)
CSW contacted [facility] SNF to inquire about ability to offer patient a bed, no answer from admissions staff member Shawna Orleans. CSW left voicemail requesting return phone call.   CSW contacted [facility] SN[facility] staff member Thayer Ohm and requested review of patient's referral to see if SNF is able to offer patient a bed. Staff agreed to review patient's referral.  CSW will continue to follow and assist with discharge planning.  Celso Sickle, [place] Clinical Social Worker [facility] Cell#: [phone]

## 2017-09-21 NOTE — Progress Notes (Signed)
Patient with extreme agitation at the beginning of the shift. Patient attempting to get out of bed unassisted and setting off bed alarm. Patient also swinging at staff, trying to hit and bite them. Very agitated and combative. MD on call was notified and a one time order of IV ativan was received and given to patient. Ativan IV helped to calm patient down. Will continue to monitor patient.

## 2017-09-21 NOTE — Progress Notes (Signed)
CSW following to assist with discharge planning to SNF.  CSW submitted additional documents to [facility] for patient's PASRR. Patient's PASRR currently under manual review MUST ID [id number].  CSW contacted [facility] to inquire about ability to offer patient a bed, no answer from admissions staff. CSW left voicemail requesting return phone call.  CSW contacted patient's niece Crecencio Mc [phone]) and provided update about [facility] and [facility]. CSW informed patient's niece that patient has other bed offers, patient's niece requested to wait a little longer to see if [facility] SNF would be able to offer patient a bed. CSW agreed to follow up with patient's niece after trying to reach [facility] SNF again. Patient's niece reported that their second choice is [facility], CSW agreed to follow up with second choice as well.  CSW will continue to follow and assist with dc planning.  Celso Sickle, [place] Clinical Social Worker [facility] Cell#: [phone]

## 2017-09-22 MED ORDER — QUETIAPINE FUMARATE 50 MG PO TABS: 50.0000 mg | ORAL_TABLET | Freq: Every day | ORAL | 0 refills | Status: AC

## 2017-09-22 MED ORDER — LORAZEPAM 0.5 MG PO TABS: 0.5000 mg | ORAL_TABLET | Freq: Two times a day (BID) | ORAL | 0 refills | Status: AC

## 2017-09-22 NOTE — Progress Notes (Signed)
Reprot called to Wyvonne Lenz at [facility]

## 2017-09-22 NOTE — Clinical Social Work Placement (Signed)
Patient received and accepted bed offer at [facility]. Facility aware of patient's discharge and confirmed bed offer. [facility] contacted, patient's family notified. Patient's RN can call report to [phone], packet complete. CSW signing off, no other needs identified at this time.  CLINICAL SOCIAL WORK PLACEMENT  NOTE  Date:  09/22/2017  Patient Details  Name: Cassandra Patton MRN: [id number] Date of Birth: 05-31-1925  Clinical Social Work is seeking post-discharge placement for this patient at the Skilled  Nursing Facility level of care (*CSW will initial, date and re-position this form in  chart as items are completed):  Yes   Patient/family provided with [facility] Clinical Social Work Department's list of facilities offering this level of care within the geographic area requested by the patient (or if unable, by the patient's family).  Yes   Patient/family informed of their freedom to choose among providers that offer the needed level of care, that participate in Medicare, Medicaid or managed care program needed by the patient, have an available bed and are willing to accept the patient.  Yes   Patient/family informed of [facility]'s ownership interest in [facility] and [facility], as well as of the fact that they are under no obligation to receive care at these facilities.  PASRR submitted to EDS on 09/17/17     PASRR number received on 09/21/17     Existing PASRR number confirmed on       FL2 transmitted to all facilities in geographic area requested by pt/family on 09/18/17     FL2 transmitted to all facilities within larger geographic area on       Patient informed that his/her managed care company has contracts with or will negotiate with certain facilities, including the following:        Yes   Patient/family informed of bed offers received.  Patient chooses bed at [facility]     Physician recommends and patient chooses bed at      Patient  to be transferred to [facility] on 09/15/17.  Patient to be transferred to facility by [facility]     Patient family notified on 09/22/17 of transfer.  Name of family member notified:  Crecencio Mc     PHYSICIAN       Additional Comment:    _______________________________________________ Antionette Poles, LCSW 09/22/2017, 9:56 AM

## 2017-09-23 ENCOUNTER — Telehealth: Payer: Self-pay | Admitting: Family Medicine

## 2017-09-23 NOTE — Telephone Encounter (Signed)
I called and spoke with Cassandra Patton who is listed on the DPR, patient is currently in a Rehabilitation center in [place].

## 2017-10-29 ENCOUNTER — Telehealth: Payer: Self-pay

## 2017-10-29 NOTE — Telephone Encounter (Signed)
Matthias HughsKecia @ [facility] calling to advise they are going to see pt for PT on Monday.   CB# [phone] if needed

## 2017-12-15 ENCOUNTER — Encounter (HOSPITAL_COMMUNITY): Payer: Self-pay | Admitting: Emergency Medicine

## 2017-12-15 ENCOUNTER — Emergency Department (HOSPITAL_COMMUNITY)
Admission: EM | Admit: 2017-12-15 | Discharge: 2017-12-16 | Disposition: A | Discharge status: Home / Self Care | Payer: Medicare Other | Attending: Emergency Medicine | Admitting: Emergency Medicine

## 2017-12-15 ENCOUNTER — Other Ambulatory Visit: Payer: Self-pay

## 2017-12-15 DIAGNOSIS — Z8673 Personal history of transient ischemic attack (TIA), and cerebral infarction without residual deficits: Secondary | ICD-10-CM | POA: Insufficient documentation

## 2017-12-15 DIAGNOSIS — I10 Essential (primary) hypertension: Secondary | ICD-10-CM | POA: Insufficient documentation

## 2017-12-15 DIAGNOSIS — W19XXXA Unspecified fall, initial encounter: Secondary | ICD-10-CM

## 2017-12-15 DIAGNOSIS — R4182 Altered mental status, unspecified: Secondary | ICD-10-CM | POA: Diagnosis present

## 2017-12-15 DIAGNOSIS — Z043 Encounter for examination and observation following other accident: Secondary | ICD-10-CM | POA: Diagnosis not present

## 2017-12-15 DIAGNOSIS — Z79899 Other long term (current) drug therapy: Secondary | ICD-10-CM | POA: Diagnosis not present

## 2017-12-15 DIAGNOSIS — F0390 Unspecified dementia without behavioral disturbance: Secondary | ICD-10-CM | POA: Diagnosis not present

## 2017-12-15 DIAGNOSIS — Y92129 Unspecified place in nursing home as the place of occurrence of the external cause: Secondary | ICD-10-CM

## 2017-12-15 NOTE — ED Provider Notes (Signed)
[facility]-EMERGENCY DEPT Provider Note: [name], MD, FACEP  CSN: [id number] MRN: [id number] ARRIVAL: 12/15/17 at 2324 ROOM: WHALD/WHALD   CHIEF COMPLAINT  Altered Mental Status  Level 5 caveat: Dementia HISTORY OF PRESENT ILLNESS  12/15/17 11:43 PM Cassandra Patton is a 84 y.o. female with a history of dementia.  She was sent from her assisted living facility because she was staring into space and not responding.  When EMS arrived she was sitting up in a wheelchair.  She was noted to be alert and oriented only to name which is her baseline.   The patient herself states she fell but denies any pain or injury.  A family member who accompanies her was called by her living facility after being told she had fallen.    Past Medical History:  Diagnosis Date  . Arthritis   . Dementia   . Depression   . H/O blood clots   . Hypertension   . Stroke (HCC)   . Thyroid disease   . Urinary tract infection     History reviewed. No pertinent surgical history.  History reviewed. No pertinent family history.  Social History   Tobacco Use  . Smoking status: Never Smoker  . Smokeless tobacco: Never Used  Substance Use Topics  . Alcohol use: Never    Frequency: Never  . Drug use: Never    Prior to Admission medications   Medication Sig Start Date End Date Taking? Authorizing Provider  FLUoxetine (PROZAC) 20 MG tablet Take 1 tablet (20 mg total) by mouth daily. 09/13/17   Deeann SaintBanks, Shannon R, MD  furosemide (LASIX) 40 MG tablet Take 1 tablet (40 mg total) by mouth daily. 09/21/17   Burnadette PopAdhikari, Amrit, MD  levothyroxine (SYNTHROID, LEVOTHROID) 75 MCG tablet Take 1 tablet (75 mcg total) by mouth daily before breakfast. 09/22/17   Burnadette PopAdhikari, Amrit, MD  LORazepam (ATIVAN) 0.5 MG tablet Take 1 tablet (0.5 mg total) by mouth 2 (two) times daily. 09/22/17   Briant CedarEzenduka, Nkeiruka J, MD  Melatonin 10 MG TABS Take 10 mg by mouth at bedtime.    [provider]  metoprolol tartrate (LOPRESSOR) 25 MG  tablet Take 1 tablet (25 mg total) by mouth 2 (two) times daily. 09/13/17   Deeann SaintBanks, Shannon R, MD  Multiple Vitamins-Minerals (EYE VITAMINS PO) Take 1 capsule by mouth daily.     [provider]  potassium chloride SA (K-DUR,KLOR-CON) 20 MEQ tablet Take 1 tablet (20 mEq total) by mouth daily. 09/22/17   Burnadette PopAdhikari, Amrit, MD  QUEtiapine (SEROQUEL) 50 MG tablet Take 1 tablet (50 mg total) by mouth at bedtime. 09/22/17   Briant CedarEzenduka, Nkeiruka J, MD    Allergies Iron and Plaquenil [hydroxychloroquine sulfate]   REVIEW OF SYSTEMS     PHYSICAL EXAMINATION  Initial Vital Signs Blood pressure 112/87, pulse 68, temperature 98.3 F (36.8 C), temperature source Oral, resp. rate 18, SpO2 99 %.  Examination General: Well-developed, well-nourished female in no acute distress; appearance consistent with age of record HENT: normocephalic; no hematoma seen or palpated Eyes: pupils equal, round and reactive to light; extraocular muscles intact; right pseudophakia Neck: supple; nontender Heart: regular rate and rhythm Lungs: clear to auscultation bilaterally Abdomen: soft; nondistended; nontender; bowel sounds present Extremities: Arthritic changes; no tenderness on passive range of motion; trace edema of lower legs Neurologic: Awake, alert and oriented x 1; motor function intact in all extremities and symmetric; no facial droop Skin: Warm and dry Psychiatric: Normal mood and affect   RESULTS  Summary  of this visit's results, reviewed by myself:   EKG Interpretation  Date/Time:    Ventricular Rate:    PR Interval:    QRS Duration:   QT Interval:    QTC Calculation:   R Axis:     Text Interpretation:        Laboratory Studies: No results found for this or any previous visit (from the past 24 hour(s)). Imaging Studies: No results found.  ED COURSE and MDM  Nursing notes and initial vitals signs, including pulse oximetry, reviewed.  Vitals:   12/15/17 2327 12/15/17 2340 12/16/17  0001  BP: 112/87    Pulse: 68    Resp: 18    Temp: 98.3 F (36.8 C)    TempSrc: Oral    SpO2: 100% 99%   Weight:   69.4 kg  Height:   5\' 3"  (1.6 m)   12:32 AM The patient is living facility was contacted.  They have now report the patient was found on the floor after a presumed unwitnessed fall.  She was awake and alert at her baseline.  There is no evidence of significant trauma on exam and I do not believe any further studies are indicated at this time.  PROCEDURES    ED DIAGNOSES     ICD-10-CM   1. Fall at nursing home, initial encounter W19.XXXA    Y92.129        Paula LibraMolpus, Mikaylah Libbey, MD 12/16/17 [phone]

## 2017-12-15 NOTE — ED Triage Notes (Signed)
[facility]. Per EMS, patient was not responding to staff and staring into space. When EMS arrived, patient was sitting up in wheelchair. She is alert, oriented only to name. However, this is patients baseline. Patient is calm, cooperative, and appears in no acute distress.

## 2017-12-15 NOTE — ED Notes (Signed)
[facility] Ex[facility]:  Expected time:  Means of arrival:  Comments: [age] F from SNF "Conscious, but unresponsive in bed." Confused at baseline.

## 2017-12-16 NOTE — ED Notes (Signed)
Facility per patient's family states she was found on the floor post unwitnessed fall. She was coherent, sat up and was able to get back into the wheelchair. The facility believes she may have hit her head.

## 2018-03-19 ENCOUNTER — Encounter (HOSPITAL_COMMUNITY): Payer: Self-pay | Admitting: *Deleted

## 2018-03-19 ENCOUNTER — Emergency Department (HOSPITAL_COMMUNITY): Payer: Medicare Other

## 2018-03-19 ENCOUNTER — Other Ambulatory Visit: Payer: Self-pay

## 2018-03-19 ENCOUNTER — Emergency Department (HOSPITAL_COMMUNITY)
Admission: EM | Admit: 2018-03-19 | Discharge: 2018-03-19 | Disposition: A | Discharge status: Skilled Nursing Facility | Payer: Medicare Other | Attending: Emergency Medicine | Admitting: Emergency Medicine

## 2018-03-19 DIAGNOSIS — Z79899 Other long term (current) drug therapy: Secondary | ICD-10-CM | POA: Insufficient documentation

## 2018-03-19 DIAGNOSIS — F0390 Unspecified dementia without behavioral disturbance: Secondary | ICD-10-CM | POA: Insufficient documentation

## 2018-03-19 DIAGNOSIS — I10 Essential (primary) hypertension: Secondary | ICD-10-CM | POA: Insufficient documentation

## 2018-03-19 DIAGNOSIS — W19XXXA Unspecified fall, initial encounter: Secondary | ICD-10-CM | POA: Diagnosis not present

## 2018-03-19 DIAGNOSIS — Y998 Other external cause status: Secondary | ICD-10-CM | POA: Insufficient documentation

## 2018-03-19 DIAGNOSIS — Y92199 Unspecified place in other specified residential institution as the place of occurrence of the external cause: Secondary | ICD-10-CM | POA: Insufficient documentation

## 2018-03-19 DIAGNOSIS — Y9389 Activity, other specified: Secondary | ICD-10-CM | POA: Diagnosis not present

## 2018-03-19 DIAGNOSIS — M25561 Pain in right knee: Secondary | ICD-10-CM

## 2018-03-19 DIAGNOSIS — S8991XA Unspecified injury of right lower leg, initial encounter: Secondary | ICD-10-CM | POA: Diagnosis present

## 2018-03-19 DIAGNOSIS — S8001XA Contusion of right knee, initial encounter: Secondary | ICD-10-CM | POA: Diagnosis not present

## 2018-03-19 LAB — BASIC METABOLIC PANEL
Anion gap: 8 (ref 5–15)
BUN: 28 mg/dL — AB (ref 8–23)
CALCIUM: 9.2 mg/dL (ref 8.9–10.3)
CO2: 27 mmol/L (ref 22–32)
CREATININE: 1.05 mg/dL — AB (ref 0.44–1.00)
Chloride: 104 mmol/L (ref 98–111)
GFR calc Af Amer: 53 mL/min — ABNORMAL LOW (ref >60)
GFR, EST NON AFRICAN AMERICAN: 45 mL/min — AB (ref >60)
GLUCOSE: 108 mg/dL — AB (ref 70–99)
Potassium: 4.1 mmol/L (ref 3.5–5.1)
Sodium: 139 mmol/L (ref 135–145)

## 2018-03-19 LAB — CBC
HCT: 40 % (ref 36.0–46.0)
HEMOGLOBIN: 12.8 g/dL (ref 12.0–15.0)
MCH: 32 pg (ref 26.0–34.0)
MCHC: 32 g/dL (ref 30.0–36.0)
MCV: 100 fL (ref 80.0–100.0)
PLATELETS: 195 10*3/uL (ref 150–400)
RBC: 4 MIL/uL (ref 3.87–5.11)
RDW: 13 % (ref 11.5–15.5)
WBC: 6.3 10*3/uL (ref 4.0–10.5)
nRBC: 0 % (ref 0.0–0.2)

## 2018-03-19 MED ORDER — ACETAMINOPHEN 500 MG PO TABS
1000.0000 mg | ORAL_TABLET | Freq: Once | ORAL | Status: AC
  Administered 2018-03-19: 1000 mg via ORAL
  Filled 2018-03-19: qty 2

## 2018-03-19 NOTE — ED Notes (Signed)
Patient transported to X-ray 

## 2018-03-19 NOTE — ED Notes (Signed)
Bed: WA20 Expected date:  Expected time:  Means of arrival:  Comments: EMS from SNF 84 yo female unwitnessed fall-rolled out of bed-"hurting all over"-pain head/leg-dementia 172/84

## 2018-03-19 NOTE — ED Triage Notes (Signed)
Pt found by staff of [facility] on the floor of her room from unwitness fall. Pt with complaint of general pain. EMS with no obvious deformities or signs of injury. Pt with hx of dementia. Family made aware of pt's transport to [facility] by facility.

## 2018-03-19 NOTE — ED Notes (Addendum)
Abrasion noted to the top of the right shoulder. Ecchymosis noted as well. Area left OTA for MD/PA exam

## 2018-03-19 NOTE — ED Notes (Signed)
[facility] STAFF LEFT IN THE MIDDLE OF THE PT BEING CLEANED UP.

## 2018-03-19 NOTE — ED Notes (Signed)
PT DISCHARGED. INSTRUCTIONS GIVEN TO PTAR STAFF AAOX3. PT IN NO APPARENT DISTRESS OR PAIN. THE OPPORTUNITY TO ASK QUESTIONS WAS PROVIDED. 

## 2018-03-19 NOTE — ED Provider Notes (Signed)
[facility]-EMERGENCY DEPT Provider Note  CSN: [id number] Arrival date & time: 03/19/18 0138  Chief Complaint(s) Fall Triage Note 0152 Pt found by staff of [facility] on the floor of her room from unwitness fall. Pt with complaint of general pain. EMS with no obvious deformities or signs of injury. Pt with hx of dementia. Family made aware of pt's transport to [facility] by facility.     HPI Cassandra Patton is a 84 y.o. female who presents from facility for unwitnessed fall.  HPI  Past Medical History Past Medical History:  Diagnosis Date  . Arthritis   . Dementia (HCC)   . Depression   . H/O blood clots   . Hypertension   . Stroke (HCC)   . Thyroid disease   . Urinary tract infection    Patient Active Problem List   Diagnosis Date Noted  . Dementia with behavioral disturbance (HCC) 09/16/2017  . Bilateral lower extremity edema 09/16/2017  . Anxiety and depression 09/16/2017  . Hypothyroidism 09/16/2017  . History of CVA (cerebrovascular accident) 09/16/2017  . History of blood clots 09/16/2017  . CHF exacerbation (HCC) 09/16/2017   Home Medication(s) Prior to Admission medications   Medication Sig Start Date End Date Taking? Authorizing Provider  acetaminophen (TYLENOL) 500 MG tablet Take 500 mg by mouth every 8 (eight) hours as needed for mild pain.   Yes [provider]  escitalopram (LEXAPRO) 10 MG tablet Take 10 mg by mouth daily. 02/24/18  Yes [provider]  furosemide (LASIX) 40 MG tablet Take 1 tablet (40 mg total) by mouth daily. 09/21/17  Yes Burnadette Pop, MD  levothyroxine (SYNTHROID, LEVOTHROID) 75 MCG tablet Take 1 tablet (75 mcg total) by mouth daily before breakfast. 09/22/17  Yes Adhikari, Amrit, MD  LORazepam (ATIVAN) 0.5 MG tablet Take 0.25-0.5 mg by mouth See admin instructions. Take 0.25mg  by mouth each morning for anxiety and 0.5mg  by mouth at bedtime for sleep   Yes [provider]  Melatonin 10 MG TABS Take 10 mg  by mouth at bedtime.   Yes [provider]  metoprolol tartrate (LOPRESSOR) 25 MG tablet Take 1 tablet (25 mg total) by mouth 2 (two) times daily. 09/13/17  Yes Deeann Saint, MD  Multiple Vitamin (MULTIVITAMIN WITH MINERALS) TABS tablet Take 1 tablet by mouth daily.   Yes [provider]  Multiple Vitamins-Minerals (EYE VITAMINS PO) Take 1 capsule by mouth daily.    Yes [provider]  potassium chloride SA (K-DUR,KLOR-CON) 20 MEQ tablet Take 1 tablet (20 mEq total) by mouth daily. 09/22/17  Yes Burnadette Pop, MD  QUEtiapine (SEROQUEL) 25 MG tablet Take 12.5 mg by mouth 2 (two) times daily. 02/25/18  Yes [provider]  FLUoxetine (PROZAC) 20 MG tablet Take 1 tablet (20 mg total) by mouth daily. Patient not taking: Reported on 03/19/2018 09/13/17   Deeann Saint, MD  LORazepam (ATIVAN) 0.5 MG tablet Take 1 tablet (0.5 mg total) by mouth 2 (two) times daily. Patient not taking: Reported on 03/19/2018 09/22/17   Briant Cedar, MD  QUEtiapine (SEROQUEL) 50 MG tablet Take 1 tablet (50 mg total) by mouth at bedtime. Patient not taking: Reported on 03/19/2018 09/22/17   Briant Cedar, MD  Past Surgical History History reviewed. No pertinent surgical history. Family History History reviewed. No pertinent family history.  Social History Social History   Tobacco Use  . Smoking status: Never Smoker  . Smokeless tobacco: Never Used  Substance Use Topics  . Alcohol use: Never    Frequency: Never  . Drug use: Never   Allergies Iron and Plaquenil [hydroxychloroquine sulfate]  Review of Systems Review of Systems  Unable to perform ROS: Dementia    Physical Exam Vital Signs  I have reviewed the triage vital signs BP (!) 145/86   Pulse 72   Temp 97.8 F (36.6 C) (Oral)   Resp (!) 25   SpO2 96%   Physical  Exam  Constitutional: She is oriented to person, place, and time. She appears well-developed and well-nourished. No distress.  HENT:  Head: Normocephalic and atraumatic.  Right Ear: External ear normal.  Left Ear: External ear normal.  Nose: Nose normal.  Eyes: Pupils are equal, round, and reactive to light. Conjunctivae and EOM are normal. Right eye exhibits no discharge. Left eye exhibits no discharge. No scleral icterus.  Neck: Normal range of motion. Neck supple.  Cardiovascular: Normal rate, regular rhythm and normal heart sounds. Exam reveals no gallop and no friction rub.  No murmur heard. Pulses:      Radial pulses are 2+ on the right side, and 2+ on the left side.       Dorsalis pedis pulses are 2+ on the right side, and 2+ on the left side.  Pulmonary/Chest: Effort normal and breath sounds normal. No stridor. No respiratory distress. She has no wheezes.  Abdominal: Soft. She exhibits no distension. There is no tenderness.  Musculoskeletal: She exhibits no edema.       Right knee: She exhibits ecchymosis. She exhibits no deformity. Tenderness found.       Cervical back: She exhibits no bony tenderness.       Thoracic back: She exhibits no bony tenderness.       Lumbar back: She exhibits no bony tenderness.  Clavicles stable. Chest stable to AP/Lat compression. Pelvis stable to Lat compression. No obvious extremity deformity. No chest or abdominal wall contusion.  Neurological: She is alert and oriented to person, place, and time.  Moving all extremities  Skin: Skin is warm and dry. No rash noted. She is not diaphoretic. No erythema.  Psychiatric: She has a normal mood and affect.    ED Results and Treatments Labs (all labs ordered are listed, but only abnormal results are displayed) Labs Reviewed  BASIC METABOLIC PANEL - Abnormal; Notable for the following components:      Result Value   Glucose, Bld 108 (*)    BUN 28 (*)    Creatinine, Ser 1.05 (*)    GFR calc non  Af Amer 45 (*)    GFR calc Af Amer 53 (*)    All other components within normal limits  CBC  EKG  EKG Interpretation  Date/Time:    Ventricular Rate:    PR Interval:    QRS Duration:   QT Interval:    QTC Calculation:   R Axis:     Text Interpretation:        Radiology Ct Head Wo Contrast  Result Date: 03/19/2018 CLINICAL DATA:  Fall with headache EXAM: CT HEAD WITHOUT CONTRAST TECHNIQUE: Contiguous axial images were obtained from the base of the skull through the vertex without intravenous contrast. COMPARISON:  None. FINDINGS: Brain: There is no mass, hemorrhage or extra-axial collection. The size and configuration of the ventricles and extra-axial CSF spaces are normal. There is hypoattenuation of the periventricular white matter, most commonly indicating chronic ischemic microangiopathy. Vascular: No abnormal hyperdensity of the major intracranial arteries or dural venous sinuses. No intracranial atherosclerosis. Skull: The visualized skull base, calvarium and extracranial soft tissues are normal. Sinuses/Orbits: No fluid levels or advanced mucosal thickening of the visualized paranasal sinuses. No mastoid or middle ear effusion. The orbits are normal. IMPRESSION: Chronic ischemic microangiopathy without acute intracranial abnormality. Electronically Signed   By: Deatra Robinson M.D.   On: 03/19/2018 04:24   Dg Knee Complete 4 Views Right  Result Date: 03/19/2018 CLINICAL DATA:  Right knee pain after fall EXAM: RIGHT KNEE - COMPLETE 4+ VIEW COMPARISON:  None. FINDINGS: No evidence of fracture, dislocation, or joint effusion. There is mild narrowing of the lateral femorotibial joint space. Soft tissues are unremarkable. IMPRESSION: No acute osseous injury of the right knee. Electronically Signed   By: Deatra Robinson M.D.   On: 03/19/2018 03:24   Pertinent labs & imaging  results that were available during my care of the patient were reviewed by me and considered in my medical decision making (see chart for details).  Medications Ordered in ED Medications  acetaminophen (TYLENOL) tablet 1,000 mg (1,000 mg Oral Given 03/19/18 0249)                                                                                                                                    Procedures Procedures  (including critical care time)  Medical Decision Making / ED Course I have reviewed the nursing notes for this encounter and the patient's prior records (if available in EHR or on provided paperwork).    Unwitnessed fall.  CT head negative.  Plain film of the right knee negative.   The patient appears reasonably screened and/or stabilized for discharge and I doubt any other medical condition or other [facility] requiring further screening, evaluation, or treatment in the ED at this time prior to discharge.  The patient is safe for discharge with strict return precautions.   Final Clinical Impression(s) / ED Diagnoses Final diagnoses:  Right knee pain  Fall, initial encounter  Contusion of right knee, initial encounter    Disposition: Discharge  Condition: Good    ED Discharge Orders    None  Follow Up: Deeann Saint, MD [address] [place] [address] [phone]  Schedule an appointment as soon as possible for a visit  If symptoms do not improve or  worsen     This chart was dictated using voice recognition software.  Despite best efforts to proofread,  errors can occur which can change the documentation meaning.   Nira Conn, MD 03/19/18 [id number]

## 2018-05-27 ENCOUNTER — Other Ambulatory Visit: Payer: Self-pay

## 2018-05-27 ENCOUNTER — Encounter (HOSPITAL_COMMUNITY): Payer: Self-pay

## 2018-05-27 ENCOUNTER — Emergency Department (HOSPITAL_COMMUNITY)
Admission: EM | Admit: 2018-05-27 | Discharge: 2018-05-27 | Disposition: A | Discharge status: Home / Self Care | Payer: Medicare Other | Attending: Emergency Medicine | Admitting: Emergency Medicine

## 2018-05-27 DIAGNOSIS — E039 Hypothyroidism, unspecified: Secondary | ICD-10-CM | POA: Insufficient documentation

## 2018-05-27 DIAGNOSIS — Z79899 Other long term (current) drug therapy: Secondary | ICD-10-CM | POA: Insufficient documentation

## 2018-05-27 DIAGNOSIS — I110 Hypertensive heart disease with heart failure: Secondary | ICD-10-CM | POA: Insufficient documentation

## 2018-05-27 DIAGNOSIS — I509 Heart failure, unspecified: Secondary | ICD-10-CM | POA: Diagnosis not present

## 2018-05-27 DIAGNOSIS — I10 Essential (primary) hypertension: Secondary | ICD-10-CM | POA: Insufficient documentation

## 2018-05-27 DIAGNOSIS — R4689 Other symptoms and signs involving appearance and behavior: Secondary | ICD-10-CM | POA: Diagnosis not present

## 2018-05-27 DIAGNOSIS — F02818 Dementia in other diseases classified elsewhere, unspecified severity, with other behavioral disturbance: Secondary | ICD-10-CM

## 2018-05-27 DIAGNOSIS — R4182 Altered mental status, unspecified: Secondary | ICD-10-CM | POA: Diagnosis present

## 2018-05-27 DIAGNOSIS — F0281 Dementia in other diseases classified elsewhere with behavioral disturbance: Secondary | ICD-10-CM

## 2018-05-27 DIAGNOSIS — F0390 Unspecified dementia without behavioral disturbance: Secondary | ICD-10-CM | POA: Insufficient documentation

## 2018-05-27 DIAGNOSIS — G301 Alzheimer's disease with late onset: Secondary | ICD-10-CM | POA: Insufficient documentation

## 2018-05-27 LAB — URINALYSIS, ROUTINE W REFLEX MICROSCOPIC
BILIRUBIN URINE: NEGATIVE
Bacteria, UA: NONE SEEN
GLUCOSE, UA: NEGATIVE mg/dL
HGB URINE DIPSTICK: NEGATIVE
Ketones, ur: NEGATIVE mg/dL
NITRITE: NEGATIVE
PROTEIN: NEGATIVE mg/dL
SPECIFIC GRAVITY, URINE: 1.015 (ref 1.005–1.030)
pH: 5 (ref 5.0–8.0)

## 2018-05-27 NOTE — ED Triage Notes (Signed)
Patient BIB EMS from [facility], alzheimer's unit. EMS called because increasing combativeness over the past 5 days, including throwing dishes at other residents. Patient hit, bit, and was verbally abusive to EMS staff. Patient was given 2.5mg  Haldol IM at 1700 and 5mg  Midazolam IM at approx 1716 by EMS staff for agitation. Patient is awake and alert to self only. Patient remains agitated and trying to get out of bed in triage.

## 2018-05-27 NOTE — ED Notes (Signed)
Bed: LS93 Expected date:  Expected time:  Means of arrival:  Comments: 85 yo agitation, aggressive-received 2 Haldol

## 2018-05-27 NOTE — ED Notes (Signed)
Pt family at bedside. Pt given meal.

## 2018-05-27 NOTE — ED Notes (Signed)
Patient ambulated to the restroom with x1 person assist.
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
## 2018-05-27 NOTE — ED Notes (Signed)
Pt left escorted  with family

## 2018-05-27 NOTE — Discharge Instructions (Addendum)
We saw Cassandra Patton in the ER because of her disruptive behavior. The paramedics gave her intramuscular Haldol and Versed -and since arriving the ER she has been calm. It does appear that she has some tendencies of paranoia along with forgetfulness. We think that her behavior today was a result of her dementia, and not underlying psychiatry condition.  Her medication review shows that she is on Seroquel and Ativan.. Please ensure she is getting both of those medications as prescribed.  We also recommend that you have the staff psychiatry or medical provider assess her if you are noticing increased episodes of hostility.  Finally, we also checked a urine and there is no signs of infection.

## 2018-06-04 ENCOUNTER — Emergency Department (HOSPITAL_COMMUNITY): Payer: Medicare Other

## 2018-06-04 ENCOUNTER — Emergency Department (HOSPITAL_COMMUNITY)
Admission: EM | Admit: 2018-06-04 | Discharge: 2018-06-04 | Disposition: A | Discharge status: Skilled Nursing Facility | Payer: Medicare Other | Attending: Emergency Medicine | Admitting: Emergency Medicine

## 2018-06-04 DIAGNOSIS — Z79899 Other long term (current) drug therapy: Secondary | ICD-10-CM | POA: Insufficient documentation

## 2018-06-04 DIAGNOSIS — Y939 Activity, unspecified: Secondary | ICD-10-CM | POA: Diagnosis not present

## 2018-06-04 DIAGNOSIS — I110 Hypertensive heart disease with heart failure: Secondary | ICD-10-CM | POA: Insufficient documentation

## 2018-06-04 DIAGNOSIS — F0390 Unspecified dementia without behavioral disturbance: Secondary | ICD-10-CM | POA: Diagnosis not present

## 2018-06-04 DIAGNOSIS — E039 Hypothyroidism, unspecified: Secondary | ICD-10-CM | POA: Diagnosis not present

## 2018-06-04 DIAGNOSIS — W19XXXA Unspecified fall, initial encounter: Secondary | ICD-10-CM | POA: Insufficient documentation

## 2018-06-04 DIAGNOSIS — Y999 Unspecified external cause status: Secondary | ICD-10-CM | POA: Insufficient documentation

## 2018-06-04 DIAGNOSIS — I509 Heart failure, unspecified: Secondary | ICD-10-CM | POA: Diagnosis not present

## 2018-06-04 DIAGNOSIS — R51 Headache: Secondary | ICD-10-CM | POA: Diagnosis present

## 2018-06-04 DIAGNOSIS — Y929 Unspecified place or not applicable: Secondary | ICD-10-CM | POA: Diagnosis not present

## 2018-06-04 NOTE — ED Provider Notes (Signed)
[facility]-EMERGENCY DEPT Provider Note   CSN: [id number] Arrival date & time: 06/04/18  1719     History   Chief Complaint Chief Complaint  Patient presents with  . Fall    HPI Cassandra Patton is a 85 y.o. female.  The history is provided by the patient, the EMS personnel and medical records. No language interpreter was used.  Fall  Associated symptoms include headaches.  Cassandra Patton is a 85 y.o. female  with a PMH as listed below including dementia who presents to the Emergency Department from facility for evaluation after unwitnessed fall.  Per EMS, she was sitting on the ground upon their arrival.  Patient states that she does not know what happened and is unable to give any history regarding today's events.  She reports headache to me, but no other arthralgias or myalgias.  When seen by my attending, she reported hip pain as well.   Level V caveat applies 2/2 dementia   Past Medical History:  Diagnosis Date  . Arthritis   . Dementia (HCC)   . Depression   . H/O blood clots   . Hypertension   . Stroke (HCC)   . Thyroid disease   . Urinary tract infection     Patient Active Problem List   Diagnosis Date Noted  . Dementia with behavioral disturbance (HCC) 09/16/2017  . Bilateral lower extremity edema 09/16/2017  . Anxiety and depression 09/16/2017  . Hypothyroidism 09/16/2017  . History of CVA (cerebrovascular accident) 09/16/2017  . History of blood clots 09/16/2017  . CHF exacerbation (HCC) 09/16/2017    No past surgical history on file.   OB History   No obstetric history on file.      Home Medications    Prior to Admission medications   Medication Sig Start Date End Date Taking? Authorizing Provider  acetaminophen (TYLENOL) 500 MG tablet Take 500 mg by mouth every 8 (eight) hours as needed for mild pain.   Yes [provider]  ARIPiprazole (ABILIFY) 2 MG tablet Take 2 mg by mouth daily. 05/18/18  Yes [provider]  divalproex (DEPAKOTE SPRINKLE) 125 MG capsule Take 125 mg by mouth 3 (three) times daily.  05/27/18  Yes [provider]  furosemide (LASIX) 40 MG tablet Take 1 tablet (40 mg total) by mouth daily. 09/21/17  Yes Burnadette PopAdhikari, Amrit, MD  levothyroxine (SYNTHROID, LEVOTHROID) 75 MCG tablet Take 1 tablet (75 mcg total) by mouth daily before breakfast. 09/22/17  Yes Adhikari, Amrit, MD  LORazepam (ATIVAN) 0.5 MG tablet Take 1 tablet (0.5 mg total) by mouth 2 (two) times daily. Patient taking differently: Take 0.5 mg by mouth at bedtime.  09/22/17  Yes Briant CedarEzenduka, Nkeiruka J, MD  LORazepam (ATIVAN) 0.5 MG tablet Take 0.5 mg by mouth daily as needed (agitation).   Yes [provider]  Melatonin 10 MG TABS Take 10 mg by mouth at bedtime.   Yes [provider]  metoprolol tartrate (LOPRESSOR) 25 MG tablet Take 1 tablet (25 mg total) by mouth 2 (two) times daily. 09/13/17  Yes Deeann SaintBanks, Shannon R, MD  Multiple Vitamin (MULTIVITAMIN WITH MINERALS) TABS tablet Take 1 tablet by mouth daily.   Yes [provider]  Multiple Vitamins-Minerals (EYE VITAMINS PO) Take 1 tablet by mouth daily.    Yes [provider]  potassium chloride SA (K-DUR,KLOR-CON) 20 MEQ tablet Take 1 tablet (20 mEq total) by mouth daily. 09/22/17  Yes Burnadette PopAdhikari, Amrit, MD  FLUoxetine (PROZAC) 20 MG  tablet Take 1 tablet (20 mg total) by mouth daily. Patient not taking: Reported on 03/19/2018 09/13/17   Deeann Saint, MD  QUEtiapine (SEROQUEL) 50 MG tablet Take 1 tablet (50 mg total) by mouth at bedtime. Patient not taking: Reported on 06/04/2018 09/22/17   Briant Cedar, MD    Family History No family history on file.  Social History Social History   Tobacco Use  . Smoking status: Never Smoker  . Smokeless tobacco: Never Used  Substance Use Topics  . Alcohol use: Never    Frequency: Never  . Drug use: Never     Allergies   Iron and Plaquenil [hydroxychloroquine  sulfate]   Review of Systems Review of Systems  Unable to perform ROS: Dementia  Musculoskeletal: Positive for arthralgias and myalgias. Negative for back pain and neck pain.  Neurological: Positive for headaches.     Physical Exam Updated Vital Signs BP (!) 170/70   Pulse 66   Temp (!) 97.4 F (36.3 C) (Oral)   Resp (!) 8   SpO2 98%   Physical Exam Vitals signs and nursing note reviewed.  Constitutional:      General: She is not in acute distress.    Appearance: She is well-developed.  HENT:     Head: Normocephalic and atraumatic.  Neck:     Musculoskeletal: Neck supple.  Cardiovascular:     Rate and Rhythm: Normal rate and regular rhythm.     Heart sounds: Normal heart sounds. No murmur.  Pulmonary:     Effort: Pulmonary effort is normal. No respiratory distress.     Breath sounds: Normal breath sounds.  Abdominal:     General: There is no distension.     Palpations: Abdomen is soft.     Tenderness: There is no abdominal tenderness.  Musculoskeletal:     Comments: No C/T/L-spine tenderness.  Full range of motion of all 4 extremities.  Skin:    General: Skin is warm and dry.  Neurological:     Mental Status: She is alert and oriented to person, place, and time.     Comments: Cranial nerves II through XII grossly intact.  Strength and sensation intact.      ED Treatments / Results  Labs (all labs ordered are listed, but only abnormal results are displayed) Labs Reviewed - No data to display  EKG EKG Interpretation  Date/Time:  Saturday June 04 2018 17:31:13 EST Ventricular Rate:  63 PR Interval:    QRS Duration: 83 QT Interval:  375 QTC Calculation: 384 R Axis:   65 Text Interpretation:  Sinus rhythm Low voltage, extremity and precordial leads Baseline wander in lead(s) II III aVF Confirmed by Tilden Fossa [id number]) on 06/04/2018 5:36:51 PM   Radiology Ct Head Wo Contrast  Result Date: 06/04/2018 CLINICAL DATA:  Unwitnessed fall. EXAM: CT HEAD  WITHOUT CONTRAST CT CERVICAL SPINE WITHOUT CONTRAST TECHNIQUE: Multidetector CT imaging of the head and cervical spine was performed following the standard protocol without intravenous contrast. Multiplanar CT image reconstructions of the cervical spine were also generated. COMPARISON:  CT scan of March 19, 2018. FINDINGS: CT HEAD FINDINGS Brain: Mild chronic ischemic white matter disease is noted. Mild diffuse cortical atrophy is noted. No mass effect or midline shift is noted. Ventricular size is within normal limits. There is no evidence of mass lesion, hemorrhage or acute infarction. Vascular: No hyperdense vessel or unexpected calcification. Skull: Normal. Negative for fracture or focal lesion. Sinuses/Orbits: No acute finding. Other: None. CT CERVICAL SPINE  FINDINGS Alignment: Mild grade 1 anterolisthesis of C5-6 is noted secondary to posterior facet joint hypertrophy. Skull base and vertebrae: No acute fracture. No primary bone lesion or focal pathologic process. Soft tissues and spinal canal: No prevertebral fluid or swelling. No visible canal hematoma. Disc levels:  Severe degenerative disc disease is noted at C6-7. Upper chest: Negative. Other: Degenerative changes are seen involving the left-sided posterior facet joints. IMPRESSION: Mild chronic ischemic white matter disease. Mild diffuse cortical atrophy. No acute intracranial abnormality seen. Severe degenerative disc disease is noted at C6-7. No acute abnormality seen in the cervical spine. Electronically Signed   By: Lupita RaiderJames  Green Jr, M.D.   On: 06/04/2018 18:58   Ct Cervical Spine Wo Contrast  Result Date: 06/04/2018 CLINICAL DATA:  Unwitnessed fall. EXAM: CT HEAD WITHOUT CONTRAST CT CERVICAL SPINE WITHOUT CONTRAST TECHNIQUE: Multidetector CT imaging of the head and cervical spine was performed following the standard protocol without intravenous contrast. Multiplanar CT image reconstructions of the cervical spine were also generated. COMPARISON:   CT scan of March 19, 2018. FINDINGS: CT HEAD FINDINGS Brain: Mild chronic ischemic white matter disease is noted. Mild diffuse cortical atrophy is noted. No mass effect or midline shift is noted. Ventricular size is within normal limits. There is no evidence of mass lesion, hemorrhage or acute infarction. Vascular: No hyperdense vessel or unexpected calcification. Skull: Normal. Negative for fracture or focal lesion. Sinuses/Orbits: No acute finding. Other: None. CT CERVICAL SPINE FINDINGS Alignment: Mild grade 1 anterolisthesis of C5-6 is noted secondary to posterior facet joint hypertrophy. Skull base and vertebrae: No acute fracture. No primary bone lesion or focal pathologic process. Soft tissues and spinal canal: No prevertebral fluid or swelling. No visible canal hematoma. Disc levels:  Severe degenerative disc disease is noted at C6-7. Upper chest: Negative. Other: Degenerative changes are seen involving the left-sided posterior facet joints. IMPRESSION: Mild chronic ischemic white matter disease. Mild diffuse cortical atrophy. No acute intracranial abnormality seen. Severe degenerative disc disease is noted at C6-7. No acute abnormality seen in the cervical spine. Electronically Signed   By: Lupita RaiderJames  Green Jr, M.D.   On: 06/04/2018 18:58   Dg Hip Unilat W Or Wo Pelvis 2-3 Views Left  Result Date: 06/04/2018 CLINICAL DATA:  85 year old female status post unwitnessed fall. Left hip pain. EXAM: DG HIP (WITH OR WITHOUT PELVIS) 2-3V LEFT COMPARISON:  Right hip series 10/22/2017. FINDINGS: Femoral heads are normally located. Hip joint spaces are stable and normal for age. Stable left inguinal surgical clips. The pelvis appears stable and intact. Grossly intact proximal right femur. Intact proximal left femur. SI joints appear stable. Negative visible abdominal and pelvic visceral contours. IMPRESSION: No acute fracture or dislocation identified about the left hip or pelvis. Electronically Signed   By: Odessa FlemingH  Hall  M.D.   On: 06/04/2018 18:59    Procedures Procedures (including critical care time)  Medications Ordered in ED Medications - No data to display   Initial Impression / Assessment and Plan / ED Course  I have reviewed the triage vital signs and the nursing notes.  Pertinent labs & imaging results that were available during my care of the patient were reviewed by me and considered in my medical decision making (see chart for details).    Cassandra Patton is a 85 y.o. female who presents to ED from facility for evaluation after fall at facility.  Reassuring examination.  EKG without acute ischemic changes.  Imaging without acute findings. Evaluation does not show pathology that  would require ongoing emergent intervention or inpatient treatment.  Discharged back to facility.  Patient seen by and discussed with Dr. Madilyn Hook who agrees with treatment plan.    Final Clinical Impressions(s) / ED Diagnoses   Final diagnoses:  Fall, initial encounter    ED Discharge Orders    None       Ozzy Bohlken, Chase Picket, PA-C 06/04/18 2036    Tilden Fossa, MD 06/05/18 [phone]

## 2018-06-04 NOTE — ED Notes (Signed)
Unable to obtain latest V/S pt. REFUSED, pt. If confused.uncooperative.  PA notified.

## 2018-06-04 NOTE — ED Triage Notes (Signed)
Transported by [facility] from [facility]-- unwitnessed fall, patient initially reported lower back pain however no pain upon arrival to ED. VSS with EMS. Hx of dementia, AAO x 2.

## 2018-06-04 NOTE — Discharge Instructions (Signed)
It was my pleasure taking care of you today!   Follow up with your doctor as needed.   Return to ER for new symptoms, any additional concerns.

## 2018-06-04 NOTE — ED Notes (Signed)
Pt.'s nephew accompanied pt. Back to her AL. Per POV.

## 2018-06-04 NOTE — ED Notes (Signed)
Bed: WA19 Expected date:  Expected time:  Means of arrival:  Comments: EMS-fall 
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
## 2018-10-18 ENCOUNTER — Emergency Department (HOSPITAL_COMMUNITY): Payer: Medicare Other

## 2018-10-18 ENCOUNTER — Emergency Department (HOSPITAL_COMMUNITY)
Admission: EM | Admit: 2018-10-18 | Discharge: 2018-10-19 | Disposition: A | Discharge status: Home / Self Care | Payer: Medicare Other | Attending: Emergency Medicine | Admitting: Emergency Medicine

## 2018-10-18 DIAGNOSIS — R079 Chest pain, unspecified: Secondary | ICD-10-CM

## 2018-10-18 DIAGNOSIS — Z8673 Personal history of transient ischemic attack (TIA), and cerebral infarction without residual deficits: Secondary | ICD-10-CM | POA: Diagnosis not present

## 2018-10-18 DIAGNOSIS — R0789 Other chest pain: Secondary | ICD-10-CM | POA: Diagnosis not present

## 2018-10-18 DIAGNOSIS — Z79899 Other long term (current) drug therapy: Secondary | ICD-10-CM | POA: Insufficient documentation

## 2018-10-18 DIAGNOSIS — I110 Hypertensive heart disease with heart failure: Secondary | ICD-10-CM | POA: Diagnosis not present

## 2018-10-18 DIAGNOSIS — F0390 Unspecified dementia without behavioral disturbance: Secondary | ICD-10-CM | POA: Diagnosis not present

## 2018-10-18 DIAGNOSIS — E039 Hypothyroidism, unspecified: Secondary | ICD-10-CM | POA: Diagnosis not present

## 2018-10-18 DIAGNOSIS — I509 Heart failure, unspecified: Secondary | ICD-10-CM | POA: Diagnosis not present

## 2018-10-18 LAB — CBC WITH DIFFERENTIAL/PLATELET
Abs Immature Granulocytes: 0.04 10*3/uL (ref 0.00–0.07)
Basophils Absolute: 0 10*3/uL (ref 0.0–0.1)
Basophils Relative: 1 %
Eosinophils Absolute: 0 10*3/uL (ref 0.0–0.5)
Eosinophils Relative: 1 %
HCT: 39.7 % (ref 36.0–46.0)
Hemoglobin: 12.8 g/dL (ref 12.0–15.0)
Immature Granulocytes: 1 %
Lymphocytes Relative: 24 %
Lymphs Abs: 1.9 10*3/uL (ref 0.7–4.0)
MCH: 33 pg (ref 26.0–34.0)
MCHC: 32.2 g/dL (ref 30.0–36.0)
MCV: 102.3 fL — ABNORMAL HIGH (ref 80.0–100.0)
Monocytes Absolute: 0.8 10*3/uL (ref 0.1–1.0)
Monocytes Relative: 10 %
Neutro Abs: 5 10*3/uL (ref 1.7–7.7)
Neutrophils Relative %: 63 %
Platelets: 203 10*3/uL (ref 150–400)
RBC: 3.88 MIL/uL (ref 3.87–5.11)
RDW: 13.2 % (ref 11.5–15.5)
WBC: 7.8 10*3/uL (ref 4.0–10.5)
nRBC: 0 % (ref 0.0–0.2)

## 2018-10-18 LAB — COMPREHENSIVE METABOLIC PANEL WITH GFR
ALT: 26 U/L (ref 0–44)
AST: 48 U/L — ABNORMAL HIGH (ref 15–41)
Albumin: 3.3 g/dL — ABNORMAL LOW (ref 3.5–5.0)
Alkaline Phosphatase: 44 U/L (ref 38–126)
Anion gap: 12 (ref 5–15)
BUN: 28 mg/dL — ABNORMAL HIGH (ref 8–23)
CO2: 24 mmol/L (ref 22–32)
Calcium: 8.8 mg/dL — ABNORMAL LOW (ref 8.9–10.3)
Chloride: 100 mmol/L (ref 98–111)
Creatinine, Ser: 1.36 mg/dL — ABNORMAL HIGH (ref 0.44–1.00)
GFR calc Af Amer: 39 mL/min — ABNORMAL LOW (ref >60)
GFR calc non Af Amer: 34 mL/min — ABNORMAL LOW (ref >60)
Glucose, Bld: 103 mg/dL — ABNORMAL HIGH (ref 70–99)
Potassium: 4.7 mmol/L (ref 3.5–5.1)
Sodium: 136 mmol/L (ref 135–145)
Total Bilirubin: 0.4 mg/dL (ref 0.3–1.2)
Total Protein: 7 g/dL (ref 6.5–8.1)

## 2018-10-18 LAB — TROPONIN I (HIGH SENSITIVITY)
Troponin I (High Sensitivity): 4 ng/L (ref <18)
Troponin I (High Sensitivity): 4.2 ng/L (ref <18)

## 2018-10-18 MED ORDER — CYCLOBENZAPRINE HCL 5 MG PO TABS: 10.0000 mg | ORAL_TABLET | Freq: Two times a day (BID) | ORAL | 0 refills | Status: AC | PRN

## 2018-10-18 NOTE — ED Notes (Signed)
Pt dressed.

## 2018-10-18 NOTE — ED Notes (Signed)
Report called to Facility, [facility]

## 2018-10-18 NOTE — ED Provider Notes (Signed)
[facility]-EMERGENCY DEPT Provider Note   CSN: [id number] Arrival date & time: 10/18/18  1527    History   Chief Complaint Chief Complaint  Patient presents with  . Chest Pain  . Groin Pain    HPI Cassandra Patton is a 85 y.o. female.     HPI   85 year old female with a history of dementia, depression, hypertension, CVA, thyroid disease, presents with concern for chest pain.  Reports a stabbing chest pain that worsens with arm movements, sitting up, palpation, and deep breaths.  Denies trauma or falls.  Denies shortness of breath, nausea, vomiting, abdominal pain.  Reports that her symptoms began earlier today.  She also reports groin pain, and by this she points to the vaginal area.  Reports that she had already spoken with her physician about this.  Per family, patient had been diagnosed with COVID at the beginning of June.  On EMS arrival, they reported that she had no symptoms of COVID but have been exposed at her living facility.  No cough, fever or other symptoms.  Past Medical History:  Diagnosis Date  . Arthritis   . Dementia (HCC)   . Depression   . H/O blood clots   . Hypertension   . Stroke (HCC)   . Thyroid disease   . Urinary tract infection     Patient Active Problem List   Diagnosis Date Noted  . Dementia with behavioral disturbance (HCC) 09/16/2017  . Bilateral lower extremity edema 09/16/2017  . Anxiety and depression 09/16/2017  . Hypothyroidism 09/16/2017  . History of CVA (cerebrovascular accident) 09/16/2017  . History of blood clots 09/16/2017  . CHF exacerbation (HCC) 09/16/2017    No past surgical history on file.   OB History   No obstetric history on file.      Home Medications    Prior to Admission medications   Medication Sig Start Date End Date Taking? Authorizing Provider  ARIPiprazole (ABILIFY) 2 MG tablet Take 3 mg by mouth daily. Take 1.5 tablets (3 mg) daily 05/18/18  Yes [provider]   clonazePAM (KLONOPIN) 0.5 MG tablet Take 0.5 mg by mouth at bedtime.  09/18/18  Yes [provider]  divalproex (DEPAKOTE SPRINKLE) 125 MG capsule Take 125 mg by mouth 3 (three) times daily.  05/27/18  Yes [provider]  escitalopram (LEXAPRO) 5 MG tablet Take 5 mg by mouth daily.  10/06/18  Yes [provider]  furosemide (LASIX) 20 MG tablet Take 20 mg by mouth daily.   Yes [provider]  levothyroxine (SYNTHROID, LEVOTHROID) 75 MCG tablet Take 1 tablet (75 mcg total) by mouth daily before breakfast. 09/22/17  Yes Adhikari, Amrit, MD  Melatonin 10 MG TABS Take 10 mg by mouth at bedtime.   Yes [provider]  metoprolol tartrate (LOPRESSOR) 25 MG tablet Take 1 tablet (25 mg total) by mouth 2 (two) times daily. 09/13/17  Yes Deeann SaintBanks, Shannon R, MD  Multiple Vitamin (MULTIVITAMIN WITH MINERALS) TABS tablet Take 1 tablet by mouth daily.   Yes [provider]  Multiple Vitamins-Minerals (EYE VITAMINS PO) Take 1 tablet by mouth daily.    Yes [provider]  NYSTATIN powder Apply 1 Bottle topically daily.  10/05/18  Yes [provider]  OLANZapine (ZYPREXA) 2.5 MG tablet Take 1.25 mg by mouth at bedtime.  09/29/18  Yes [provider]  potassium chloride SA (K-DUR,KLOR-CON) 20 MEQ tablet Take 1 tablet (20 mEq total) by mouth daily. 09/22/17  Yes Shelly Coss, MD  sulfamethoxazole-trimethoprim (BACTRIM DS) 800-160 MG tablet Take 1 tablet by mouth 2 (two) times daily.   Yes [provider]  cyclobenzaprine (FLEXERIL) 5 MG tablet Take 2 tablets (10 mg total) by mouth 2 (two) times daily as needed for muscle spasms. 10/18/18   Gareth Morgan, MD  FLUoxetine (PROZAC) 20 MG tablet Take 1 tablet (20 mg total) by mouth daily. Patient not taking: Reported on 03/19/2018 09/13/17   Billie Ruddy, MD  furosemide (LASIX) 40 MG tablet Take 1 tablet (40 mg total) by mouth daily. Patient not taking: Reported on 10/18/2018 09/21/17    Shelly Coss, MD  LORazepam (ATIVAN) 0.5 MG tablet Take 1 tablet (0.5 mg total) by mouth 2 (two) times daily. Patient not taking: Reported on 10/18/2018 09/22/17   Alma Friendly, MD  QUEtiapine (SEROQUEL) 50 MG tablet Take 1 tablet (50 mg total) by mouth at bedtime. Patient not taking: Reported on 06/04/2018 09/22/17   Alma Friendly, MD    Family History No family history on file.  Social History Social History   Tobacco Use  . Smoking status: Never Smoker  . Smokeless tobacco: Never Used  Substance Use Topics  . Alcohol use: Never    Frequency: Never  . Drug use: Never     Allergies   Iron and Plaquenil [hydroxychloroquine sulfate]   Review of Systems Review of Systems  Constitutional: Negative for fever.  Respiratory: Negative for shortness of breath.   Cardiovascular: Positive for chest pain.  Gastrointestinal: Negative for abdominal pain, nausea and vomiting.  Genitourinary: Positive for vaginal pain.  Musculoskeletal: Positive for arthralgias.  Skin: Negative for rash.  Neurological: Negative for headaches.     Physical Exam Updated Vital Signs BP (!) 128/55   Pulse 79   Temp 98.5 F (36.9 C) (Oral)   Resp 20   Ht 5\' 3"  (1.6 m)   Wt 66.2 kg   SpO2 98%   BMI 25.86 kg/m   Physical Exam Vitals signs and nursing note reviewed.  Constitutional:      General: She is not in acute distress.    Appearance: She is well-developed. She is not diaphoretic.  HENT:     Head: Normocephalic and atraumatic.  Eyes:     Conjunctiva/sclera: Conjunctivae normal.  Neck:     Musculoskeletal: Normal range of motion.  Cardiovascular:     Rate and Rhythm: Normal rate and regular rhythm.     Heart sounds: Normal heart sounds. No murmur. No friction rub. No gallop.      Comments: Severe chest wall tenderness Pulmonary:     Effort: Pulmonary effort is normal. No respiratory distress.     Breath sounds: Normal breath sounds. No wheezing or rales.  Abdominal:      General: There is no distension.     Palpations: Abdomen is soft.     Tenderness: There is no abdominal tenderness. There is no guarding.  Musculoskeletal:        General: No tenderness.  Skin:    General: Skin is warm and dry.     Findings: No erythema or rash.  Neurological:     Mental Status: She is alert and oriented to person, place, and time.      ED Treatments / Results  Labs (all labs ordered are listed, but only abnormal results are displayed) Labs Reviewed  CBC WITH DIFFERENTIAL/PLATELET - Abnormal; Notable for the following components:      Result Value   MCV 102.3 (*)  All other components within normal limits  COMPREHENSIVE METABOLIC PANEL - Abnormal; Notable for the following components:   Glucose, Bld 103 (*)    BUN 28 (*)    Creatinine, Ser 1.36 (*)    Calcium 8.8 (*)    Albumin 3.3 (*)    AST 48 (*)    GFR calc non Af Amer 34 (*)    GFR calc Af Amer 39 (*)    All other components within normal limits  TROPONIN I (HIGH SENSITIVITY)  TROPONIN I (HIGH SENSITIVITY)  TROPONIN I (HIGH SENSITIVITY)    EKG EKG Interpretation  Date/Time:  Tuesday October 18 2018 16:01:20 EDT Ventricular Rate:  67 PR Interval:    QRS Duration: 76 QT Interval:  374 QTC Calculation: 395 R Axis:   88 Text Interpretation:  Sinus rhythm Borderline right axis deviation Borderline low voltage, extremity leads No significant change since last tracing Confirmed by Alvira MondaySchlossman, Orvilla Truett ([id number]) on 10/18/2018 5:16:57 PM   Radiology Dg Chest Portable 1 View  Result Date: 10/18/2018 CLINICAL DATA:  Chest pain. EXAM: PORTABLE CHEST 1 VIEW COMPARISON:  09/16/2017 FINDINGS: The cardiac silhouette, mediastinal and hilar contours are within normal limits and stable. There is moderate tortuosity and calcification of the thoracic aorta. Stable moderate eventration of the right hemidiaphragm and stable moderate to large hiatal hernia. No infiltrates, edema or effusions. No worrisome pulmonary  lesions. The bony thorax is intact. IMPRESSION: No acute cardiopulmonary findings. Electronically Signed   By: Rudie MeyerP.  Gallerani M.D.   On: 10/18/2018 17:37    Procedures Procedures (including critical care time)  Medications Ordered in ED Medications - No data to display   Initial Impression / Assessment and Plan / ED Course  I have reviewed the triage vital signs and the nursing notes.  Pertinent labs & imaging results that were available during my care of the patient were reviewed by me and considered in my medical decision making (see chart for details).         85 year old female with a history of dementia, depression, hypertension, CVA, thyroid disease, presents with concern for chest pain.  EKG without significant findings.  Delta troponins were obtained and low.  She has equal bilateral upper and lower extremity pulses, low suspicion for dissection.  She has severe chest wall tenderness as well as back tenderness, as well as pain with movements, and I suspect most likely her symptoms are secondary to musculoskeletal pain, possibly from using her walker.  Given rx for flexeril.  Patient declines vaginal exam.  Patient discharged in stable condition with understanding of reasons to return.   Final Clinical Impressions(s) / ED Diagnoses   Final diagnoses:  Musculoskeletal chest pain  Chest pain, unspecified type    ED Discharge Orders         Ordered    cyclobenzaprine (FLEXERIL) 5 MG tablet  2 times daily PRN     10/18/18 2046           Alvira MondaySchlossman, Safira Proffit, MD 10/19/18 0127

## 2018-10-18 NOTE — ED Notes (Signed)
Bed: OZ36 Expected date:  Expected time:  Means of arrival:  Comments: EMS 85 yo burning chest pain, has been around covid pts

## 2018-10-18 NOTE — ED Notes (Signed)
Legal Guardian Marcelino Scot [phone]

## 2018-10-18 NOTE — ED Triage Notes (Signed)
Pt to ED via EMS from [facility]. Pt c/o intermittent. chest pain, worse with movement. Pt c/o groin pain. Hx dementia. Orientation at baseline. A&O X 2. DNR. No symptoms of COVID, but has been exposed at living facility. #20 LAC. No medications given.

## 2018-10-18 NOTE — ED Notes (Signed)
[facility] called for transport.  

## 2019-05-22 ENCOUNTER — Encounter (HOSPITAL_COMMUNITY): Payer: Self-pay | Admitting: *Deleted

## 2019-05-22 ENCOUNTER — Emergency Department (HOSPITAL_COMMUNITY)

## 2019-05-22 ENCOUNTER — Other Ambulatory Visit: Payer: Self-pay

## 2019-05-22 ENCOUNTER — Emergency Department (HOSPITAL_COMMUNITY)
Admission: EM | Admit: 2019-05-22 | Discharge: 2019-05-23 | Disposition: A | Discharge status: Home / Self Care | Attending: Emergency Medicine | Admitting: Emergency Medicine

## 2019-05-22 DIAGNOSIS — N390 Urinary tract infection, site not specified: Secondary | ICD-10-CM | POA: Insufficient documentation

## 2019-05-22 DIAGNOSIS — E039 Hypothyroidism, unspecified: Secondary | ICD-10-CM | POA: Insufficient documentation

## 2019-05-22 DIAGNOSIS — I110 Hypertensive heart disease with heart failure: Secondary | ICD-10-CM | POA: Diagnosis not present

## 2019-05-22 DIAGNOSIS — I509 Heart failure, unspecified: Secondary | ICD-10-CM | POA: Diagnosis not present

## 2019-05-22 DIAGNOSIS — F0390 Unspecified dementia without behavioral disturbance: Secondary | ICD-10-CM | POA: Insufficient documentation

## 2019-05-22 DIAGNOSIS — Z79899 Other long term (current) drug therapy: Secondary | ICD-10-CM | POA: Insufficient documentation

## 2019-05-22 DIAGNOSIS — R4182 Altered mental status, unspecified: Secondary | ICD-10-CM | POA: Diagnosis present

## 2019-05-22 LAB — CBC WITH DIFFERENTIAL/PLATELET
Abs Immature Granulocytes: 0.05 10*3/uL (ref 0.00–0.07)
Basophils Absolute: 0 10*3/uL (ref 0.0–0.1)
Basophils Relative: 0 %
Eosinophils Absolute: 0.1 10*3/uL (ref 0.0–0.5)
Eosinophils Relative: 1 %
HCT: 43.3 % (ref 36.0–46.0)
Hemoglobin: 13.8 g/dL (ref 12.0–15.0)
Immature Granulocytes: 1 %
Lymphocytes Relative: 22 %
Lymphs Abs: 2 10*3/uL (ref 0.7–4.0)
MCH: 32.2 pg (ref 26.0–34.0)
MCHC: 31.9 g/dL (ref 30.0–36.0)
MCV: 101.2 fL — ABNORMAL HIGH (ref 80.0–100.0)
Monocytes Absolute: 0.8 10*3/uL (ref 0.1–1.0)
Monocytes Relative: 9 %
Neutro Abs: 6 10*3/uL (ref 1.7–7.7)
Neutrophils Relative %: 67 %
Platelets: 229 10*3/uL (ref 150–400)
RBC: 4.28 MIL/uL (ref 3.87–5.11)
RDW: 13.1 % (ref 11.5–15.5)
WBC: 9 10*3/uL (ref 4.0–10.5)
nRBC: 0 % (ref 0.0–0.2)

## 2019-05-22 LAB — BASIC METABOLIC PANEL
Anion gap: 9 (ref 5–15)
BUN: 28 mg/dL — ABNORMAL HIGH (ref 8–23)
CO2: 30 mmol/L (ref 22–32)
Calcium: 8.9 mg/dL (ref 8.9–10.3)
Chloride: 103 mmol/L (ref 98–111)
Creatinine, Ser: 1.09 mg/dL — ABNORMAL HIGH (ref 0.44–1.00)
GFR calc Af Amer: 51 mL/min — ABNORMAL LOW (ref >60)
GFR calc non Af Amer: 44 mL/min — ABNORMAL LOW (ref >60)
Glucose, Bld: 120 mg/dL — ABNORMAL HIGH (ref 70–99)
Potassium: 4.8 mmol/L (ref 3.5–5.1)
Sodium: 142 mmol/L (ref 135–145)

## 2019-05-22 LAB — URINALYSIS, ROUTINE W REFLEX MICROSCOPIC
Bilirubin Urine: NEGATIVE
Glucose, UA: NEGATIVE mg/dL
Ketones, ur: NEGATIVE mg/dL
Nitrite: NEGATIVE
Protein, ur: 100 mg/dL — AB
RBC / HPF: >50 RBC/hpf — ABNORMAL HIGH (ref 0–5)
Specific Gravity, Urine: 1.018 (ref 1.005–1.030)
WBC, UA: >50 WBC/hpf — ABNORMAL HIGH (ref 0–5)
pH: 5 (ref 5.0–8.0)

## 2019-05-22 MED ORDER — CEPHALEXIN 500 MG PO CAPS: 500.0000 mg | ORAL_CAPSULE | Freq: Four times a day (QID) | ORAL | 0 refills | Status: AC

## 2019-05-22 NOTE — Discharge Instructions (Signed)
Recommend recheck with primary doctor regarding urinary tract infection.  If she develops fever, abdominal pain or vomiting, return to ER for reassessment.  Please take antibiotic as prescribed.

## 2019-05-22 NOTE — ED Notes (Signed)
Patient transported to X-ray via stretcher 

## 2019-05-22 NOTE — ED Provider Notes (Signed)
[facility]-EMERGENCY DEPT Provider Note   CSN: [id number] Arrival date & time: 05/22/19  1725     History Chief Complaint  Patient presents with  . Altered Mental Status    Cassandra Patton is a 86 y.o. female.  Presents to the emergency department with concern for episode of unresponsiveness.  Staff at facility reported that she was noted to be briefly unresponsive, but then started acting normally.  Patient states that she remembers having an episode of feeling lightheaded and passing out.  She denies falling or hitting her head.  She has no acute medical complaints at this time.  Specifically she denies any chest pain, abdominal pain, hip pain neck pain or back pain.   History obtained from report, niece.  Limited due to patient's baseline dementia.  Level 5 caveat.  HPI     Past Medical History:  Diagnosis Date  . Arthritis   . Dementia (HCC)   . Depression   . H/O blood clots   . Hypertension   . Stroke (HCC)   . Thyroid disease   . Urinary tract infection     Patient Active Problem List   Diagnosis Date Noted  . Dementia with behavioral disturbance (HCC) 09/16/2017  . Bilateral lower extremity edema 09/16/2017  . Anxiety and depression 09/16/2017  . Hypothyroidism 09/16/2017  . History of CVA (cerebrovascular accident) 09/16/2017  . History of blood clots 09/16/2017  . CHF exacerbation (HCC) 09/16/2017    History reviewed. No pertinent surgical history.   OB History   No obstetric history on file.     No family history on file.  Social History   Tobacco Use  . Smoking status: Never Smoker  . Smokeless tobacco: Never Used  Substance Use Topics  . Alcohol use: Never  . Drug use: Never    Home Medications Prior to Admission medications   Medication Sig Start Date End Date Taking? Authorizing Provider  acetaminophen (TYLENOL) 500 MG tablet Take 500 mg by mouth 2 (two) times daily.   Yes [provider]  clonazePAM  (KLONOPIN) 0.5 MG tablet Take 0.5 mg by mouth at bedtime.  09/18/18  Yes [provider]  divalproex (DEPAKOTE SPRINKLE) 125 MG capsule Take 125 mg by mouth 2 (two) times daily.  05/27/18  Yes [provider]  escitalopram (LEXAPRO) 5 MG tablet Take 2.5 mg by mouth at bedtime.  10/06/18  Yes [provider]  furosemide (LASIX) 20 MG tablet Take 20 mg by mouth daily.   Yes [provider]  levothyroxine (SYNTHROID, LEVOTHROID) 75 MCG tablet Take 1 tablet (75 mcg total) by mouth daily before breakfast. 09/22/17  Yes Adhikari, Amrit, MD  Melatonin 10 MG TABS Take 10 mg by mouth at bedtime.   Yes [provider]  metoprolol tartrate (LOPRESSOR) 25 MG tablet Take 1 tablet (25 mg total) by mouth 2 (two) times daily. 09/13/17  Yes Deeann Saint, MD  Multiple Vitamin (MULTIVITAMIN WITH MINERALS) TABS tablet Take 1 tablet by mouth daily.   Yes [provider]  Multiple Vitamins-Minerals (EYE VITAMINS PO) Take 1 tablet by mouth daily.    Yes [provider]  NYSTATIN powder Apply 1 Bottle topically daily.  10/05/18  Yes [provider]  OLANZapine (ZYPREXA) 2.5 MG tablet Take 1.25-2.5 mg by mouth See admin instructions. 2.5mg  bid & 1.25mg  qhs 09/29/18  Yes [provider]  potassium chloride SA (K-DUR,KLOR-CON) 20 MEQ tablet Take 1 tablet (20 mEq total) by mouth daily.  09/22/17  Yes Burnadette Pop, MD  cephALEXin (KEFLEX) 500 MG capsule Take 1 capsule (500 mg total) by mouth 4 (four) times daily for 7 days. 05/22/19 05/29/19  Milagros Loll, MD  cyclobenzaprine (FLEXERIL) 5 MG tablet Take 2 tablets (10 mg total) by mouth 2 (two) times daily as needed for muscle spasms. Patient not taking: Reported on 05/22/2019 10/18/18   Alvira Monday, MD  FLUoxetine (PROZAC) 20 MG tablet Take 1 tablet (20 mg total) by mouth daily. Patient not taking: Reported on 03/19/2018 09/13/17   Deeann Saint, MD  furosemide (LASIX) 40 MG tablet Take 1  tablet (40 mg total) by mouth daily. Patient not taking: Reported on 10/18/2018 09/21/17   Burnadette Pop, MD  LORazepam (ATIVAN) 0.5 MG tablet Take 1 tablet (0.5 mg total) by mouth 2 (two) times daily. Patient not taking: Reported on 10/18/2018 09/22/17   Briant Cedar, MD  QUEtiapine (SEROQUEL) 50 MG tablet Take 1 tablet (50 mg total) by mouth at bedtime. Patient not taking: Reported on 06/04/2018 09/22/17   Briant Cedar, MD    Allergies    Iron and Plaquenil [hydroxychloroquine sulfate]  Review of Systems   Review of Systems  Unable to perform ROS: Dementia    Physical Exam Updated Vital Signs BP 136/67 (BP Location: Left Arm)   Pulse 87   Temp 97.9 F (36.6 C) (Oral)   Resp 14   SpO2 94%   Physical Exam Vitals and nursing note reviewed.  Constitutional:      General: She is not in acute distress.    Appearance: She is well-developed.     Comments: Pleasantly demented, well-appearing  HENT:     Head: Normocephalic and atraumatic.  Eyes:     Conjunctiva/sclera: Conjunctivae normal.  Cardiovascular:     Rate and Rhythm: Normal rate and regular rhythm.     Heart sounds: No murmur.  Pulmonary:     Effort: Pulmonary effort is normal. No respiratory distress.     Breath sounds: Normal breath sounds.  Abdominal:     Palpations: Abdomen is soft.     Tenderness: There is no abdominal tenderness.  Musculoskeletal:     Cervical back: Neck supple.     Comments: No tenderness to palpation throughout all 4 extremities, no tenderness palpation over C, T, L-spine  Skin:    General: Skin is warm and dry.  Neurological:     General: No focal deficit present.     Mental Status: She is alert.     Comments: Alert, answers some questions appropriately, oriented to person but not time or place     ED Results / Procedures / Treatments   Labs (all labs ordered are listed, but only abnormal results are displayed) Labs Reviewed  CBC WITH DIFFERENTIAL/PLATELET - Abnormal;  Notable for the following components:      Result Value   MCV 101.2 (*)    All other components within normal limits  BASIC METABOLIC PANEL - Abnormal; Notable for the following components:   Glucose, Bld 120 (*)    BUN 28 (*)    Creatinine, Ser 1.09 (*)    GFR calc non Af Amer 44 (*)    GFR calc Af Amer 51 (*)    All other components within normal limits  URINALYSIS, ROUTINE W REFLEX MICROSCOPIC - Abnormal; Notable for the following components:   APPearance TURBID (*)    Hgb urine dipstick SMALL (*)    Protein, ur 100 (*)  Leukocytes,Ua SMALL (*)    RBC / HPF >50 (*)    WBC, UA >50 (*)    Bacteria, UA MANY (*)    All other components within normal limits    EKG None  Radiology DG Chest 2 View  Result Date: 05/22/2019 CLINICAL DATA:  Altered mental status. EXAM: CHEST - 2 VIEW COMPARISON:  October 18, 2018 FINDINGS: There is no evidence of acute infiltrate, pleural effusion or pneumothorax. There is a large gastric hernia. The cardiac silhouette is mildly enlarged. Moderate severity calcification of the aortic arch is seen. Degenerative changes seen throughout the thoracic spine. IMPRESSION: 1. No acute cardiopulmonary disease. 2. Large gastric hernia. Electronically Signed   By: Virgina Norfolk M.D.   On: 05/22/2019 18:44   CT Head Wo Contrast  Result Date: 05/22/2019 CLINICAL DATA:  Altered mental status. EXAM: CT HEAD WITHOUT CONTRAST TECHNIQUE: Contiguous axial images were obtained from the base of the skull through the vertex without intravenous contrast. COMPARISON:  March 19, 2018 FINDINGS: Brain: There is moderate severity cerebral atrophy with widening of the extra-axial spaces and ventricular dilatation. There are areas of decreased attenuation within the white matter tracts of the supratentorial brain, consistent with microvascular disease changes. Vascular: No hyperdense vessel or unexpected calcification. Skull: Normal. Negative for fracture or focal lesion.  Sinuses/Orbits: There is marked severity right maxillary sinus mucosal thickening. Moderate severity anterior right ethmoid sinus and right-sided frontal sinus mucosal thickening is also seen. Other: None. IMPRESSION: 1. No acute intracranial pathology. 2. Age-related involutional changes and microvascular disease within the white matter tracts of the supratentorial brain. 3. Right maxillary sinus, right ethmoid sinus and right-sided frontal sinus disease. Electronically Signed   By: Virgina Norfolk M.D.   On: 05/22/2019 18:54    Procedures Procedures (including critical care time)  Medications Ordered in ED Medications - No data to display  ED Course  I have reviewed the triage vital signs and the nursing notes.  Pertinent labs & imaging results that were available during my care of the patient were reviewed by me and considered in my medical decision making (see chart for details).  Clinical Course as of May 22 2323  Mon May 22, 2019  0865 Discussed with patient's niece, POA, okay with getting basic labs, imaging studies   [RD]    Clinical Course User Index [RD] Lucrezia Starch, MD   MDM Rules/Calculators/A&P                      86 year old lady presents to ER with concern for episode of unresponsiveness, here patient is well-appearing, normal vital signs.  She has no acute complaints, baseline mental status.  CT head, CXR negative.  Labs within normal limits.  Urine concerning for urinary tract infection.  Will start a course of cephalexin.  Recommend recheck with primary doctor, believe appropriate for discharge back to facility at this time.    After the discussed management above, the patient was determined to be safe for discharge.  The patient was in agreement with this plan and all questions regarding their care were answered.  ED return precautions were discussed and the patient will return to the ED with any significant worsening of condition.   Final Clinical  Impression(s) / ED Diagnoses Final diagnoses:  Urinary tract infection without hematuria, site unspecified    Rx / DC Orders ED Discharge Orders         Ordered    cephALEXin (KEFLEX) 500 MG capsule  4 times daily     05/22/19 2322           Milagros Loll, MD 05/22/19 2326

## 2019-05-22 NOTE — ED Triage Notes (Signed)
EMS states pt was sent to hospital from [facility] due to AMS, pt has dementia and is acting per normal. She is alert and able to answer questions within reason. They reported pt was in w/c with head back is why they called. 142/[id number]% RA CBG 187

## 2019-08-06 ENCOUNTER — Emergency Department (HOSPITAL_COMMUNITY)

## 2019-08-06 ENCOUNTER — Other Ambulatory Visit: Payer: Self-pay

## 2019-08-06 ENCOUNTER — Emergency Department (HOSPITAL_COMMUNITY)
Admission: EM | Admit: 2019-08-06 | Discharge: 2019-08-06 | Disposition: A | Discharge status: Home / Self Care | Attending: Emergency Medicine | Admitting: Emergency Medicine

## 2019-08-06 DIAGNOSIS — Z79899 Other long term (current) drug therapy: Secondary | ICD-10-CM | POA: Diagnosis not present

## 2019-08-06 DIAGNOSIS — R0789 Other chest pain: Secondary | ICD-10-CM | POA: Diagnosis present

## 2019-08-06 DIAGNOSIS — I110 Hypertensive heart disease with heart failure: Secondary | ICD-10-CM | POA: Insufficient documentation

## 2019-08-06 DIAGNOSIS — F0390 Unspecified dementia without behavioral disturbance: Secondary | ICD-10-CM | POA: Diagnosis not present

## 2019-08-06 DIAGNOSIS — E039 Hypothyroidism, unspecified: Secondary | ICD-10-CM | POA: Diagnosis not present

## 2019-08-06 DIAGNOSIS — I509 Heart failure, unspecified: Secondary | ICD-10-CM | POA: Diagnosis not present

## 2019-08-06 LAB — COMPREHENSIVE METABOLIC PANEL
ALT: 7 U/L (ref 0–44)
AST: 21 U/L (ref 15–41)
Albumin: 3.1 g/dL — ABNORMAL LOW (ref 3.5–5.0)
Alkaline Phosphatase: 110 U/L (ref 38–126)
Anion gap: 7 (ref 5–15)
BUN: 16 mg/dL (ref 8–23)
CO2: 30 mmol/L (ref 22–32)
Calcium: 9.1 mg/dL (ref 8.9–10.3)
Chloride: 104 mmol/L (ref 98–111)
Creatinine, Ser: 0.85 mg/dL (ref 0.44–1.00)
GFR calc Af Amer: >60 mL/min (ref >60)
GFR calc non Af Amer: 60 mL/min — ABNORMAL LOW (ref >60)
Glucose, Bld: 102 mg/dL — ABNORMAL HIGH (ref 70–99)
Potassium: 4.8 mmol/L (ref 3.5–5.1)
Sodium: 141 mmol/L (ref 135–145)
Total Bilirubin: 0.6 mg/dL (ref 0.3–1.2)
Total Protein: 6.9 g/dL (ref 6.5–8.1)

## 2019-08-06 LAB — CBC WITH DIFFERENTIAL/PLATELET
Abs Immature Granulocytes: 0.03 10*3/uL (ref 0.00–0.07)
Basophils Absolute: 0.1 10*3/uL (ref 0.0–0.1)
Basophils Relative: 1 %
Eosinophils Absolute: 0.1 10*3/uL (ref 0.0–0.5)
Eosinophils Relative: 1 %
HCT: 41.9 % (ref 36.0–46.0)
Hemoglobin: 13.3 g/dL (ref 12.0–15.0)
Immature Granulocytes: 0 %
Lymphocytes Relative: 27 %
Lymphs Abs: 2.1 10*3/uL (ref 0.7–4.0)
MCH: 32.2 pg (ref 26.0–34.0)
MCHC: 31.7 g/dL (ref 30.0–36.0)
MCV: 101.5 fL — ABNORMAL HIGH (ref 80.0–100.0)
Monocytes Absolute: 0.8 10*3/uL (ref 0.1–1.0)
Monocytes Relative: 10 %
Neutro Abs: 4.8 10*3/uL (ref 1.7–7.7)
Neutrophils Relative %: 61 %
Platelets: 249 10*3/uL (ref 150–400)
RBC: 4.13 MIL/uL (ref 3.87–5.11)
RDW: 12.7 % (ref 11.5–15.5)
WBC: 7.8 10*3/uL (ref 4.0–10.5)
nRBC: 0 % (ref 0.0–0.2)

## 2019-08-06 LAB — TROPONIN I (HIGH SENSITIVITY)
Troponin I (High Sensitivity): 5 ng/L (ref <18)
Troponin I (High Sensitivity): 5 ng/L (ref <18)

## 2019-08-06 MED ORDER — IBUPROFEN 200 MG PO TABS
200.0000 mg | ORAL_TABLET | Freq: Once | ORAL | Status: AC
  Administered 2019-08-06: 12:00:00 200 mg via ORAL
  Filled 2019-08-06: qty 1

## 2019-08-06 NOTE — ED Provider Notes (Signed)
[facility] EMERGENCY DEPARTMENT Provider Note   CSN: [id number] Arrival date & time: 08/06/19  1101     History Chief Complaint  Patient presents with  . Chest Pain    Cassandra Patton is a 86 y.o. female.  86 year old female with history of dementia who presents with right-sided chest pain which began today.  Pain is worse with certain movements and better with rest.  It has since resolved without treatment.  She was not short of breath with it.  Denies any leg pain or swelling.  Patient lives in the facility and they were concerned because patient did not want to eat her breakfast today patient is currently on hospice.        Past Medical History:  Diagnosis Date  . Arthritis   . Dementia (HCC)   . Depression   . H/O blood clots   . Hypertension   . Stroke (HCC)   . Thyroid disease   . Urinary tract infection     Patient Active Problem List   Diagnosis Date Noted  . Dementia with behavioral disturbance (HCC) 09/16/2017  . Bilateral lower extremity edema 09/16/2017  . Anxiety and depression 09/16/2017  . Hypothyroidism 09/16/2017  . History of CVA (cerebrovascular accident) 09/16/2017  . History of blood clots 09/16/2017  . CHF exacerbation (HCC) 09/16/2017    No past surgical history on file.   OB History   No obstetric history on file.     No family history on file.  Social History   Tobacco Use  . Smoking status: Never Smoker  . Smokeless tobacco: Never Used  Substance Use Topics  . Alcohol use: Never  . Drug use: Never    Home Medications Prior to Admission medications   Medication Sig Start Date End Date Taking? Authorizing Provider  acetaminophen (TYLENOL) 500 MG tablet Take 500 mg by mouth 2 (two) times daily.    [provider]  clonazePAM (KLONOPIN) 0.5 MG tablet Take 0.5 mg by mouth at bedtime.  09/18/18   [provider]  cyclobenzaprine (FLEXERIL) 5 MG tablet Take 2 tablets (10 mg total) by mouth 2  (two) times daily as needed for muscle spasms. Patient not taking: Reported on 05/22/2019 10/18/18   Alvira Monday, MD  divalproex (DEPAKOTE SPRINKLE) 125 MG capsule Take 125 mg by mouth 2 (two) times daily.  05/27/18   [provider]  escitalopram (LEXAPRO) 5 MG tablet Take 2.5 mg by mouth at bedtime.  10/06/18   [provider]  FLUoxetine (PROZAC) 20 MG tablet Take 1 tablet (20 mg total) by mouth daily. Patient not taking: Reported on 03/19/2018 09/13/17   Deeann Saint, MD  furosemide (LASIX) 20 MG tablet Take 20 mg by mouth daily.    [provider]  furosemide (LASIX) 40 MG tablet Take 1 tablet (40 mg total) by mouth daily. Patient not taking: Reported on 10/18/2018 09/21/17   Burnadette Pop, MD  levothyroxine (SYNTHROID, LEVOTHROID) 75 MCG tablet Take 1 tablet (75 mcg total) by mouth daily before breakfast. 09/22/17   Burnadette Pop, MD  LORazepam (ATIVAN) 0.5 MG tablet Take 1 tablet (0.5 mg total) by mouth 2 (two) times daily. Patient not taking: Reported on 10/18/2018 09/22/17   Briant Cedar, MD  Melatonin 10 MG TABS Take 10 mg by mouth at bedtime.    [provider]  metoprolol tartrate (LOPRESSOR) 25 MG tablet Take 1 tablet (25 mg total) by mouth 2 (two) times daily. 09/13/17  Deeann Saint, MD  Multiple Vitamin (MULTIVITAMIN WITH MINERALS) TABS tablet Take 1 tablet by mouth daily.    [provider]  Multiple Vitamins-Minerals (EYE VITAMINS PO) Take 1 tablet by mouth daily.     [provider]  NYSTATIN powder Apply 1 Bottle topically daily.  10/05/18   [provider]  OLANZapine (ZYPREXA) 2.5 MG tablet Take 1.25-2.5 mg by mouth See admin instructions. 2.5mg  bid & 1.25mg  qhs 09/29/18   [provider]  potassium chloride SA (K-DUR,KLOR-CON) 20 MEQ tablet Take 1 tablet (20 mEq total) by mouth daily. 09/22/17   Burnadette Pop, MD  QUEtiapine (SEROQUEL) 50 MG tablet Take 1 tablet (50 mg total) by mouth at  bedtime. Patient not taking: Reported on 06/04/2018 09/22/17   Briant Cedar, MD    Allergies    Iron and Plaquenil [hydroxychloroquine sulfate]  Review of Systems   Review of Systems  All other systems reviewed and are negative.   Physical Exam Updated Vital Signs There were no vitals taken for this visit.  Physical Exam Vitals and nursing note reviewed.  Constitutional:      General: She is not in acute distress.    Appearance: Normal appearance. She is well-developed. She is not toxic-appearing.  HENT:     Head: Normocephalic and atraumatic.  Eyes:     General: Lids are normal.     Conjunctiva/sclera: Conjunctivae normal.     Pupils: Pupils are equal, round, and reactive to light.  Neck:     Thyroid: No thyroid mass.     Trachea: No tracheal deviation.  Cardiovascular:     Rate and Rhythm: Normal rate and regular rhythm.     Heart sounds: Normal heart sounds. No murmur. No gallop.   Pulmonary:     Effort: Pulmonary effort is normal. No respiratory distress.     Breath sounds: Normal breath sounds. No stridor. No decreased breath sounds, wheezing, rhonchi or rales.  Chest:     Chest wall: Tenderness present.    Abdominal:     General: Bowel sounds are normal. There is no distension.     Palpations: Abdomen is soft.     Tenderness: There is no abdominal tenderness. There is no rebound.  Musculoskeletal:        General: No tenderness. Normal range of motion.     Cervical back: Normal range of motion and neck supple.  Skin:    General: Skin is warm and dry.     Findings: No abrasion or rash.  Neurological:     Mental Status: She is alert and oriented to person, place, and time.     GCS: GCS eye subscore is 4. GCS verbal subscore is 5. GCS motor subscore is 6.     Cranial Nerves: No cranial nerve deficit.     Sensory: No sensory deficit.  Psychiatric:        Speech: Speech normal.        Behavior: Behavior normal.     ED Results / Procedures / Treatments    Labs (all labs ordered are listed, but only abnormal results are displayed) Labs Reviewed  CBC WITH DIFFERENTIAL/PLATELET  COMPREHENSIVE METABOLIC PANEL  TROPONIN I (HIGH SENSITIVITY)    EKG EKG Interpretation  Date/Time:  Sunday August 06 2019 11:12:24 EDT Ventricular Rate:  65 PR Interval:    QRS Duration: 124 QT Interval:  404 QTC Calculation: 420 R Axis:   139 Text Interpretation: Sinus rhythm Nonspecific intraventricular conduction delay Nonspecific T abnormalities,  lateral leads Minimal ST elevation, lateral leads Artifact in lead(s) I II III aVR aVL aVF V5 V6 Confirmed by Lacretia Leigh ([id number]) on 08/06/2019 11:18:24 AM   Radiology No results found.  Procedures Procedures (including critical care time)  Medications Ordered in ED Medications  ibuprofen (ADVIL) tablet 200 mg (has no administration in time range)    ED Course  I have reviewed the triage vital signs and the nursing notes.  Pertinent labs & imaging results that were available during my care of the patient were reviewed by me and considered in my medical decision making (see chart for details).    MDM Rules/Calculators/A&P                      Patient troponin is negative x2.  Chest x-ray without acute findings.  EKG without acute findings also.  Had discussion with patient's power of attorney.  Informed her of results and will discharge patient back home.  Suspect patient has chest wall pain as she was treated with ibuprofen and does feel better Final Clinical Impression(s) / ED Diagnoses Final diagnoses:  None    Rx / DC Orders ED Discharge Orders    None       Lacretia Leigh, MD 08/06/19 1445

## 2019-08-06 NOTE — ED Triage Notes (Signed)
To room via EMS from [facility] memory care. Pt didn't eat breakfast and was c/o right chest pain.  No pain at this time.   Alert to person, can tell why she wanted to come to hospital.  EMS  BP 130/80 HR 66 SpO2 97%

## 2019-08-06 NOTE — ED Notes (Signed)
Called [facility] to give report to [employer] and advise pt returning to facility.

## 2019-08-06 NOTE — ED Notes (Signed)
[facility] in ED.  Report given to [facility].  AVS and DNR given to patient.

## 2020-06-29 IMAGING — CT CT HEAD W/O CM
3 series · 15 of 47 positions shown, 18 images · non-contrast
Comparison: None.

CLINICAL DATA: Fall with headache

EXAM:
CT HEAD WITHOUT CONTRAST
TECHNIQUE: Contiguous axial images were obtained from the base of the skull
through the vertex without intravenous contrast.

[Series 2: head wo · axial · 0.47mm/px · z∈[+1674,+1804]mm · 9 of 32 slices shown, 12 images]
[im 3/32  brain]
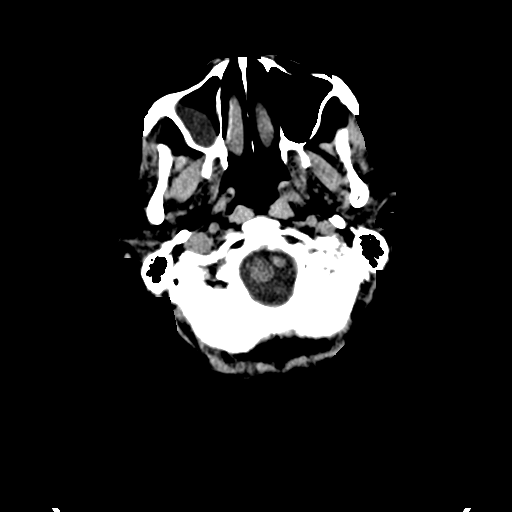
[im 3/32  bone]
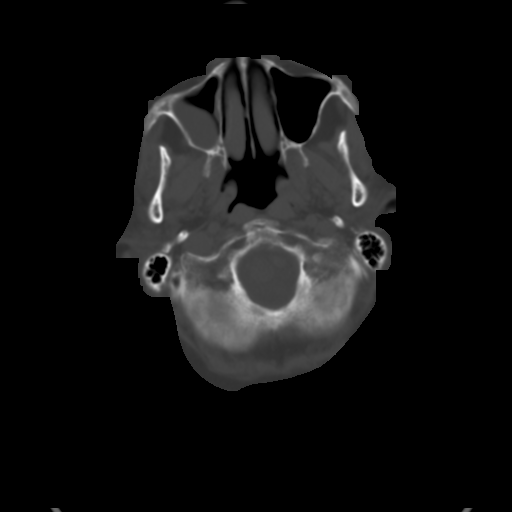
[im 6/32  brain]
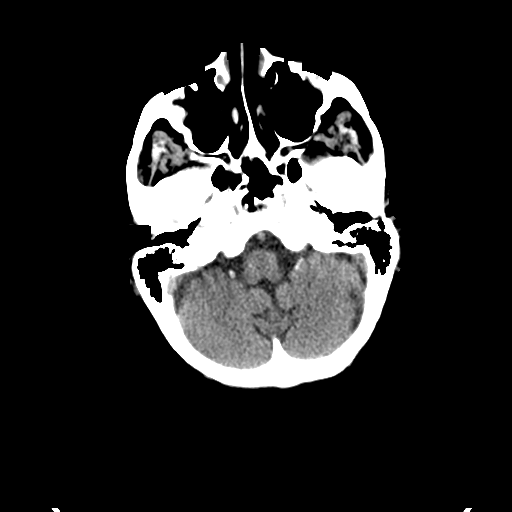
[im 9/32  brain]
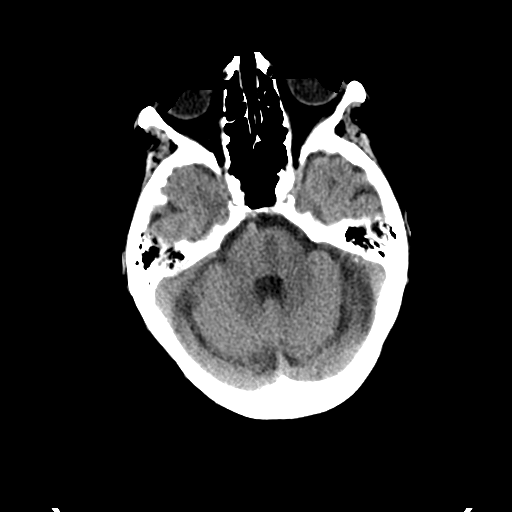
[im 12/32  brain]
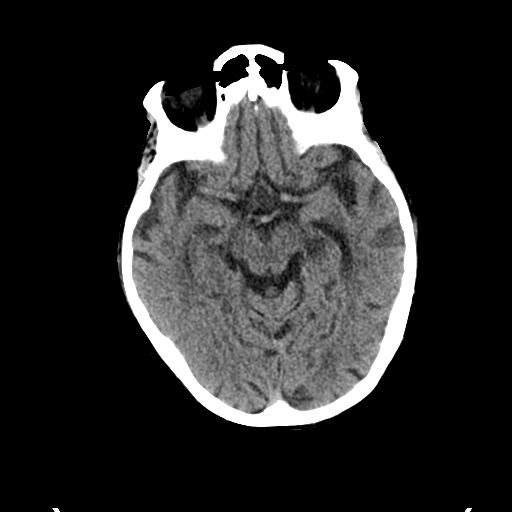
[im 17/32  brain]
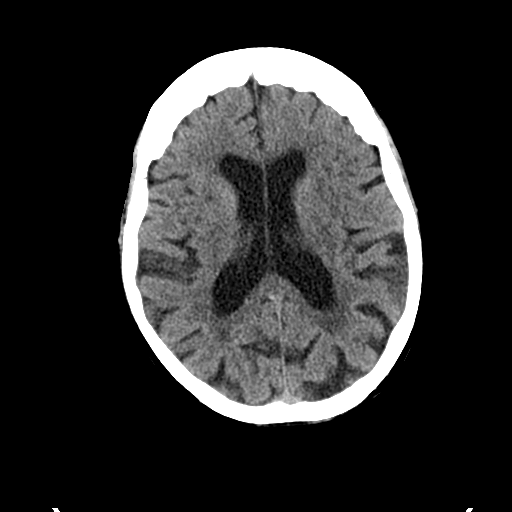
[im 17/32  bone]
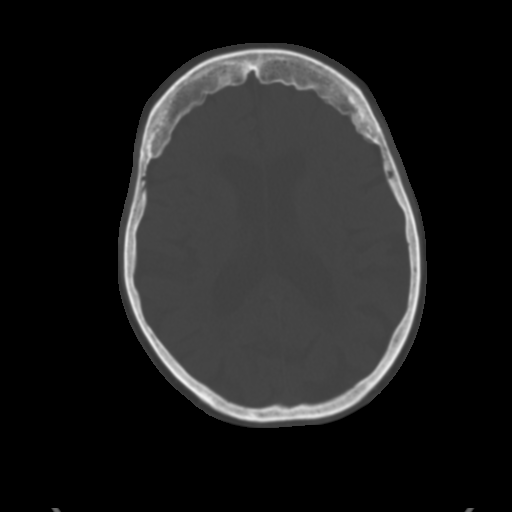
[im 20/32  brain]
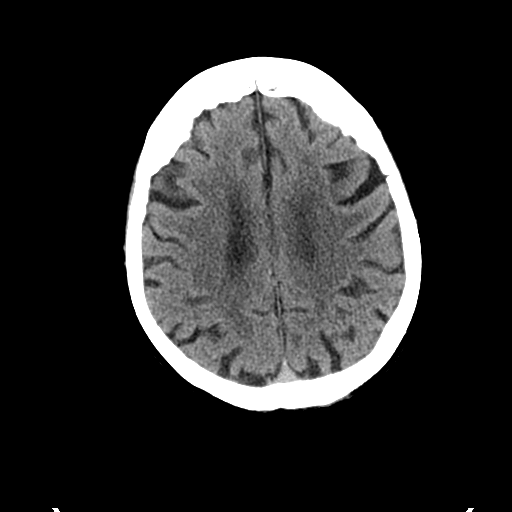
[im 23/32  brain]
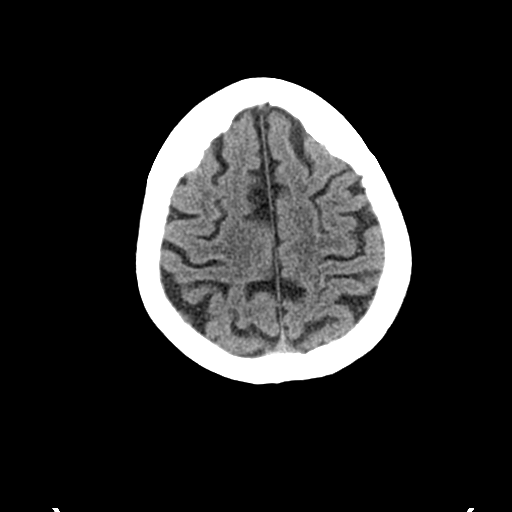
[im 26/32  brain]
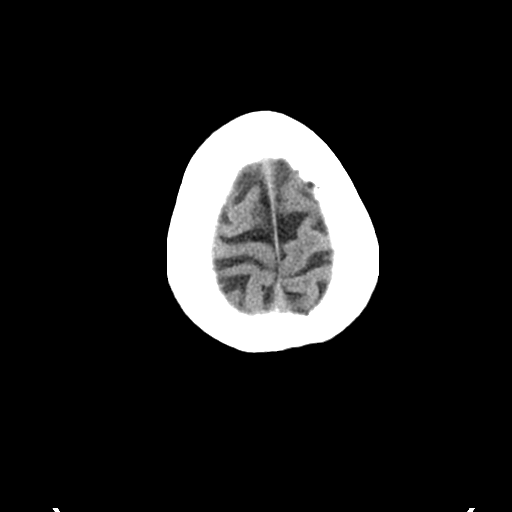
[im 29/32  brain]
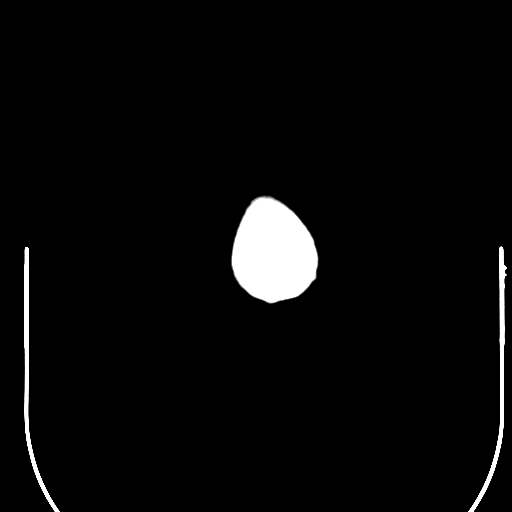
[im 29/32  bone]
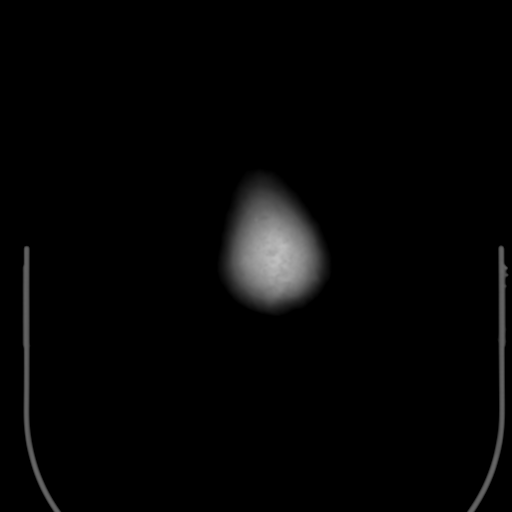

[Series 5: coronal soft tissue · coronal · 0.31mm/px · 3 of 77 slices shown]
[im 26/77  brain]
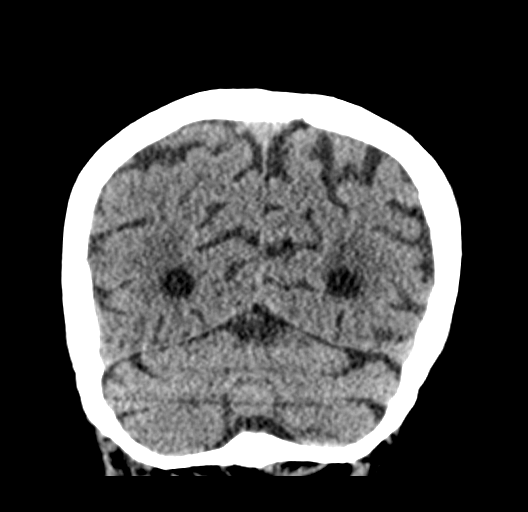
[im 34/77  brain]
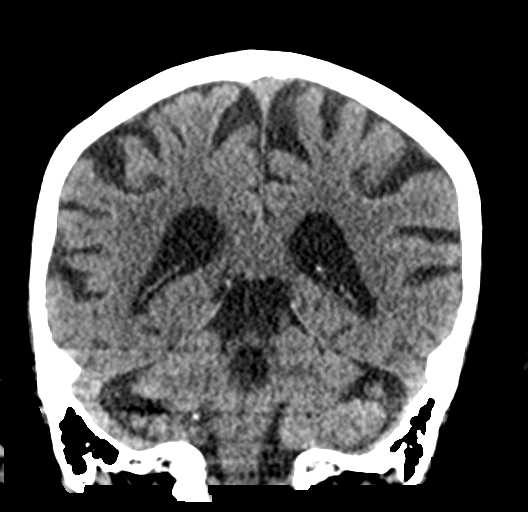
[im 43/77  brain]
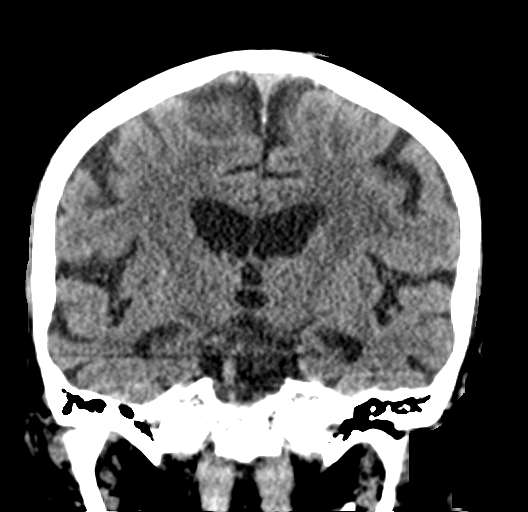

[Series 6: sagittal soft tissue · sagittal · 0.31mm/px · 3 of 56 slices shown]
[im 19/56  brain]
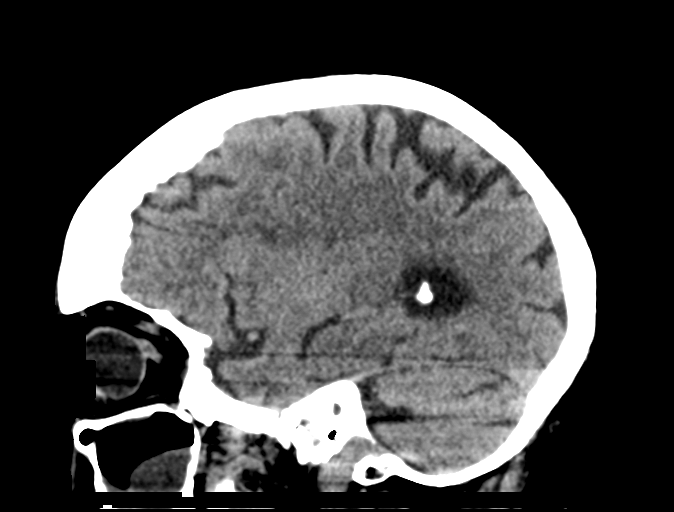
[im 28/56  brain]
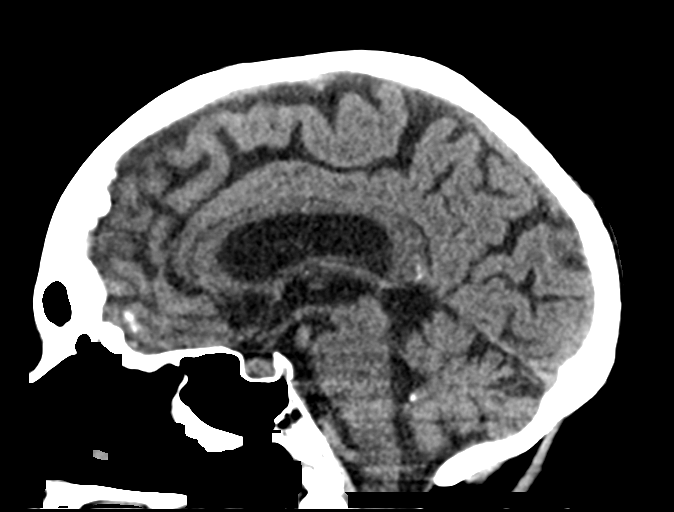
[im 37/56  brain]
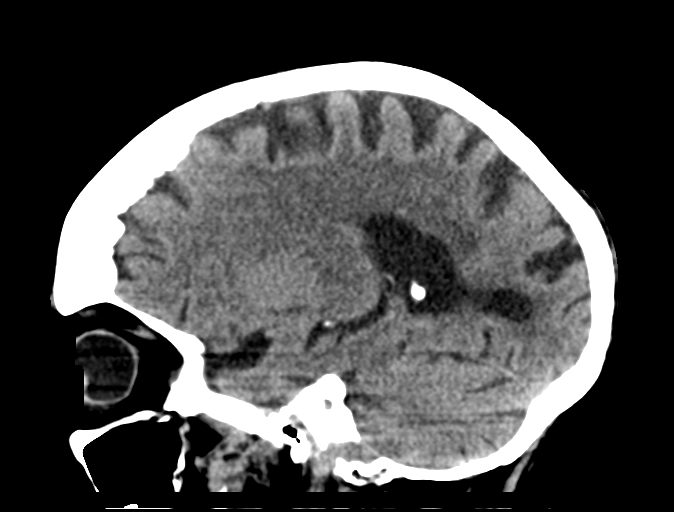

[15 of 47 positions shown; findings below may reference images not displayed]

FINDINGS: Brain: There is no mass, hemorrhage or extra-axial collection. The
size and configuration of the ventricles and extra-axial CSF spaces
are normal. There is hypoattenuation of the periventricular white
matter, most commonly indicating chronic ischemic microangiopathy.

Vascular: No abnormal hyperdensity of the major intracranial
arteries or dural venous sinuses. No intracranial atherosclerosis.

Skull: The visualized skull base, calvarium and extracranial soft
tissues are normal.

Sinuses/Orbits: No fluid levels or advanced mucosal thickening of
the visualized paranasal sinuses. No mastoid or middle ear effusion.
The orbits are normal.
IMPRESSION: Chronic ischemic microangiopathy without acute intracranial
abnormality.

## 2020-08-25 DEATH — deceased

## 2021-01-28 IMAGING — DX PORTABLE CHEST - 1 VIEW
1 series · 1 of 1 positions shown · non-contrast
Comparison: 09/16/2017

CLINICAL DATA: Chest pain.

EXAM:
PORTABLE CHEST 1 VIEW

[chest ap]
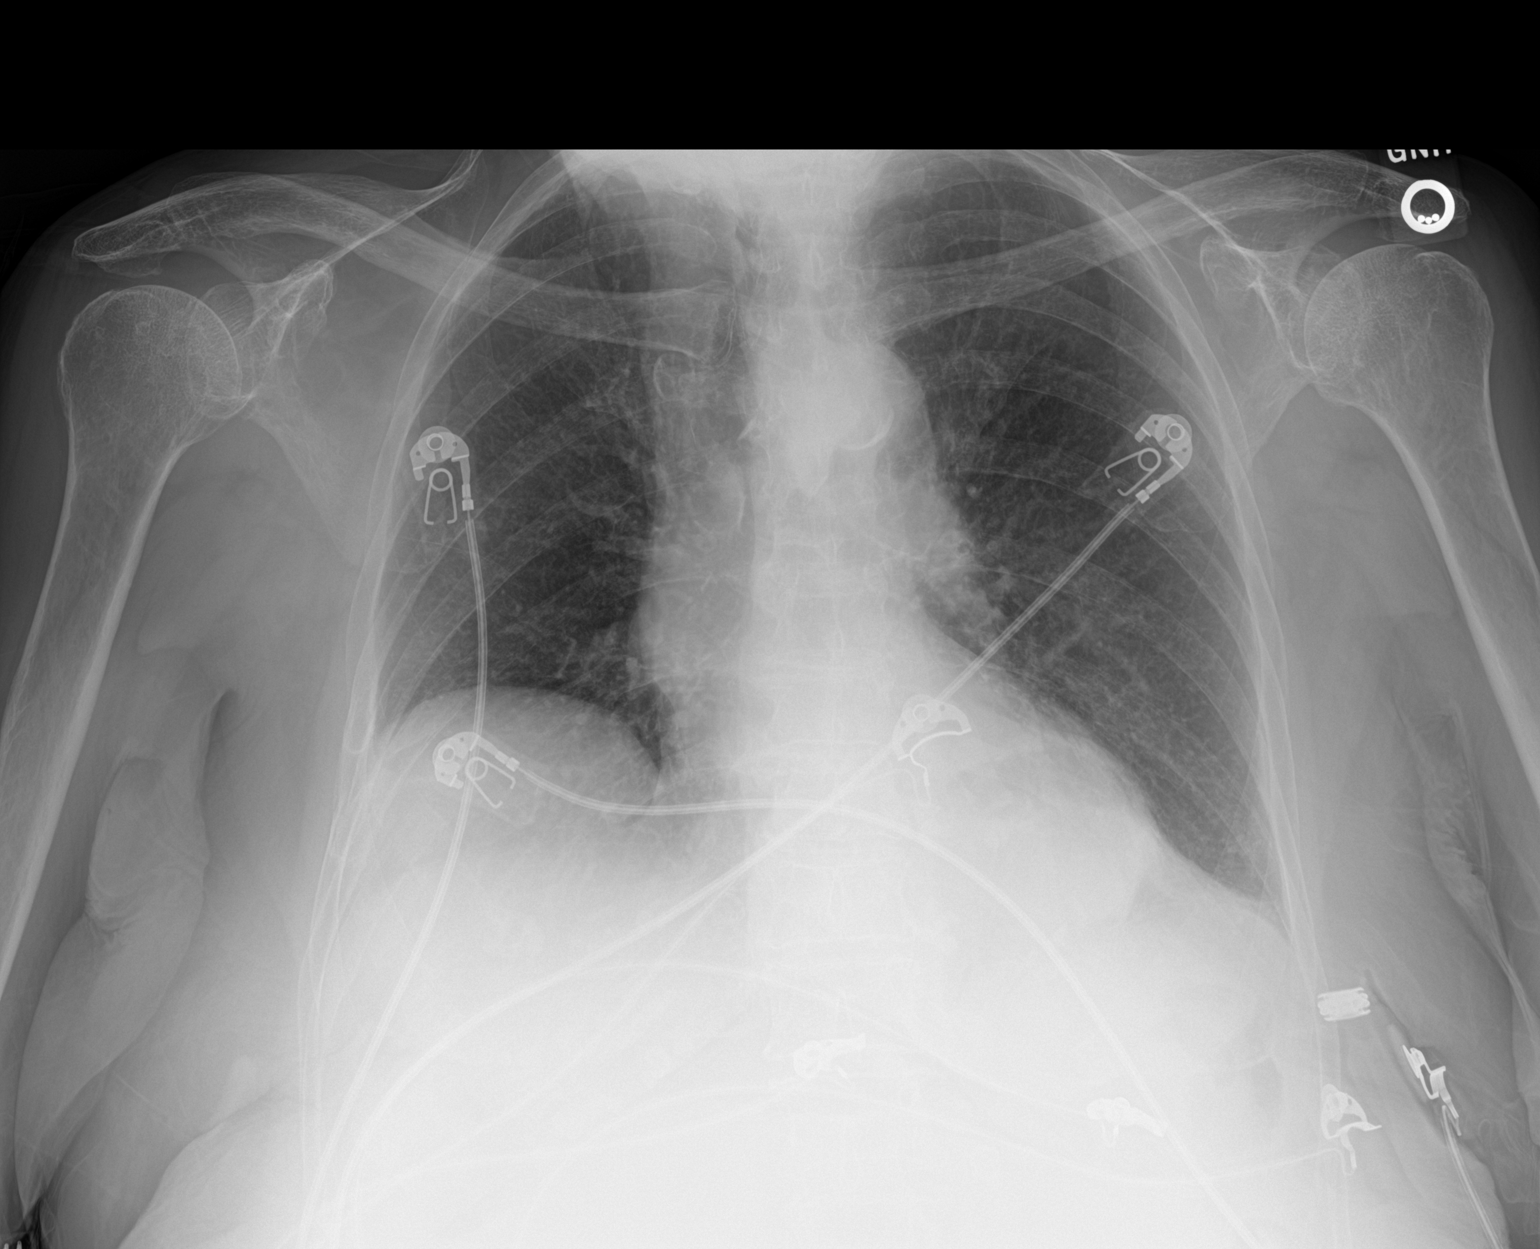

[1 of 1 positions shown; findings below may reference images not displayed]

FINDINGS: The cardiac silhouette, mediastinal and hilar contours are within
normal limits and stable. There is moderate tortuosity and
calcification of the thoracic aorta. Stable moderate eventration of
the right hemidiaphragm and stable moderate to large hiatal hernia.
No infiltrates, edema or effusions. No worrisome pulmonary lesions.
The bony thorax is intact.
IMPRESSION: No acute cardiopulmonary findings.
